# Patient Record
Sex: Male | Born: 1985 | Race: Black or African American | Hispanic: No | Marital: Single | Smoking: Current every day smoker
Health system: Southern US, Community
[De-identification: ages and names within clinical notes are randomized; demographics above are authoritative.]

## PROBLEM LIST (undated history)

## (undated) ENCOUNTER — Emergency Department (HOSPITAL_BASED_OUTPATIENT_CLINIC_OR_DEPARTMENT_OTHER): Discharge status: Absent without leave | Payer: Medicaid - Out of State

## (undated) DIAGNOSIS — G40909 Epilepsy, unspecified, not intractable, without status epilepticus: Secondary | ICD-10-CM

## (undated) DIAGNOSIS — M25519 Pain in unspecified shoulder: Secondary | ICD-10-CM

## (undated) DIAGNOSIS — M1990 Unspecified osteoarthritis, unspecified site: Secondary | ICD-10-CM

---

## 2017-07-03 ENCOUNTER — Emergency Department (HOSPITAL_COMMUNITY)
Admission: EM | Admit: 2017-07-03 | Discharge: 2017-07-03 | Disposition: A | Discharge status: Home / Self Care | Payer: Self-pay | Attending: Emergency Medicine | Admitting: Emergency Medicine

## 2017-07-03 ENCOUNTER — Encounter (HOSPITAL_COMMUNITY): Payer: Self-pay

## 2017-07-03 ENCOUNTER — Other Ambulatory Visit: Payer: Self-pay

## 2017-07-03 DIAGNOSIS — K061 Gingival enlargement: Secondary | ICD-10-CM | POA: Insufficient documentation

## 2017-07-03 DIAGNOSIS — Z5321 Procedure and treatment not carried out due to patient leaving prior to being seen by health care provider: Secondary | ICD-10-CM | POA: Insufficient documentation

## 2017-07-03 NOTE — ED Notes (Signed)
Unable to locate x 3

## 2017-07-03 NOTE — ED Triage Notes (Signed)
Patient complains of lower gum swelling and pain since am, tooth pulled a while ago

## 2017-08-19 ENCOUNTER — Emergency Department (HOSPITAL_COMMUNITY)
Admission: EM | Admit: 2017-08-19 | Discharge: 2017-08-19 | Disposition: A | Discharge status: Home / Self Care | Payer: Self-pay | Attending: Emergency Medicine | Admitting: Emergency Medicine

## 2017-08-19 ENCOUNTER — Emergency Department (HOSPITAL_COMMUNITY): Payer: Self-pay

## 2017-08-19 ENCOUNTER — Encounter (HOSPITAL_COMMUNITY): Payer: Self-pay | Admitting: Emergency Medicine

## 2017-08-19 ENCOUNTER — Other Ambulatory Visit: Payer: Self-pay

## 2017-08-19 DIAGNOSIS — M25511 Pain in right shoulder: Secondary | ICD-10-CM | POA: Insufficient documentation

## 2017-08-19 DIAGNOSIS — F17200 Nicotine dependence, unspecified, uncomplicated: Secondary | ICD-10-CM | POA: Insufficient documentation

## 2017-08-19 DIAGNOSIS — Y939 Activity, unspecified: Secondary | ICD-10-CM | POA: Insufficient documentation

## 2017-08-19 DIAGNOSIS — Y999 Unspecified external cause status: Secondary | ICD-10-CM | POA: Insufficient documentation

## 2017-08-19 DIAGNOSIS — Y929 Unspecified place or not applicable: Secondary | ICD-10-CM | POA: Insufficient documentation

## 2017-08-19 DIAGNOSIS — M25521 Pain in right elbow: Secondary | ICD-10-CM | POA: Insufficient documentation

## 2017-08-19 DIAGNOSIS — W19XXXA Unspecified fall, initial encounter: Secondary | ICD-10-CM | POA: Insufficient documentation

## 2017-08-19 MED ORDER — MORPHINE SULFATE (PF) 4 MG/ML IV SOLN: 4.0000 mg | Freq: Once | INTRAVENOUS | Status: DC

## 2017-08-19 MED ORDER — OXYCODONE-ACETAMINOPHEN 5-325 MG PO TABS
2.0000 | ORAL_TABLET | Freq: Once | ORAL | Status: AC
  Administered 2017-08-19: 2 via ORAL
  Filled 2017-08-19: qty 2

## 2017-08-19 NOTE — Progress Notes (Signed)
Orthopedic Tech Progress Note Patient Details:  Adam Ramsey 03/14/1986 [id number]  Ortho Devices Type of Ortho Device: Arm sling Ortho Device/Splint Location: RUE Ortho Device/Splint Interventions: Ordered, Application   Post Interventions Patient Tolerated: Well Instructions Provided: Care of device   [name] 08/19/2017, 10:04 PM

## 2017-08-19 NOTE — ED Triage Notes (Signed)
Patient was in shower earlier today, slipped and tried to keep himself from falling by bracing himself with his right arm.  He is now having right shoulder pain, unable to bring his arm up at all, some deformity anteriorly.  CSMTs and pulses intact.

## 2017-08-19 NOTE — Discharge Instructions (Addendum)
Please use sling as needed for pain Use ibuprofen and Tylenol as needed for pain. Follow-up with orthopedic doctor if any ongoing pain after 3 to 5 days.

## 2017-08-19 NOTE — ED Provider Notes (Addendum)
[facility] EMERGENCY DEPARTMENT Provider Note   CSN: [id number] Arrival date & time: 08/19/17  1950     History   Chief Complaint Chief Complaint  Patient presents with  . Shoulder Pain    HPI Yuchen Correll is a 32 y.o. male.  HPI  32 year old male presents today with right shoulder pain after falling.  He states he fell onto both hands.  He is having pain in the right shoulder.  He denies any numbness, tingling, or weakness.  Triage nurse documented possible deformity of right shoulder.  History reviewed. No pertinent past medical history.  There are no active problems to display for this patient.   History reviewed. No pertinent surgical history.      Home Medications    Prior to Admission medications   Not on File    Family History No family history on file.  Social History Social History   Tobacco Use  . Smoking status: Current Every Day Smoker  . Smokeless tobacco: Never Used  Substance Use Topics  . Alcohol use: Yes  . Drug use: Yes     Allergies   Patient has no known allergies.   Review of Systems Review of Systems  All other systems reviewed and are negative.    Physical Exam Updated Vital Signs BP 130/84 (BP Location: Right Arm)   Pulse 85   Temp 98 F (36.7 C) (Oral)   Resp 18   Wt 117.9 kg (260 lb)   SpO2 100%   Physical Exam  Constitutional: He is oriented to person, place, and time. He appears well-developed and well-nourished.  HENT:  Head: Normocephalic and atraumatic.  Right Ear: External ear normal.  Left Ear: External ear normal.  Eyes: Pupils are equal, round, and reactive to light. EOM are normal.  Neck: Normal range of motion.  Cardiovascular: Normal rate and regular rhythm.  Pulmonary/Chest: Effort normal and breath sounds normal.  Abdominal: Soft.  Musculoskeletal:  Right shoulder with some diffuse tenderness and some tenderness over the right elbow. No obvious deformity noted Radial pulses  intact Sensory intact ulnar and radial aspect of hand. Hand with motor intact, wrist with good flexion and extension  Neurological: He is alert and oriented to person, place, and time.  Skin: Skin is warm and dry. Capillary refill takes less than 2 seconds.  Psychiatric: He has a normal mood and affect.  Nursing note and vitals reviewed.    ED Treatments / Results  Labs (all labs ordered are listed, but only abnormal results are displayed) Labs Reviewed - No data to display  EKG None  Radiology Dg Shoulder Right  Result Date: 08/19/2017 CLINICAL DATA:  Status post fall with right shoulder pain. EXAM: RIGHT SHOULDER - 2+ VIEW COMPARISON:  None. FINDINGS: There is no evidence of acute fracture or dislocation. Deformity of the right humeral head with chronic appearance. Osteoarthritic changes at the glenohumeral joint likely posttraumatic. IMPRESSION: No acute fracture or dislocation identified about the right shoulder. Chronic appearing posttraumatic deformity of the right humeral head, with associated osteoarthritic changes of the glenohumeral joint. Electronically Signed   By: Ted Mcalpine M.D.   On: 08/19/2017 21:53   Dg Elbow 2 Views Right  Result Date: 08/19/2017 CLINICAL DATA:  Status post fall getting out of shower, with right elbow pain. Initial encounter. EXAM: RIGHT ELBOW - 2 VIEW COMPARISON:  None. FINDINGS: There is no evidence of fracture or dislocation. The visualized joint spaces are preserved. No significant joint effusion is identified.  The soft tissues are unremarkable in appearance. IMPRESSION: No evidence of fracture or dislocation. Electronically Signed   By: Roanna Raider M.D.   On: 08/19/2017 21:54    Procedures Procedures (including critical care time)  Medications Ordered in ED Medications  morphine 4 MG/ML injection 4 mg (has no administration in time range)     Initial Impression / Assessment and Plan / ED Course  I have reviewed the triage vital  signs and the nursing notes.  Pertinent labs & imaging results that were available during my care of the patient were reviewed by me and considered in my medical decision making (see chart for details).       Final Clinical Impressions(s) / ED Diagnoses   Final diagnoses:  Acute pain of right shoulder    ED Discharge Orders    None       Margarita Grizzle, MD 08/19/17 2159    Margarita Grizzle, MD 08/19/17 2218

## 2017-08-28 ENCOUNTER — Encounter (HOSPITAL_COMMUNITY): Payer: Self-pay | Admitting: Emergency Medicine

## 2017-08-28 ENCOUNTER — Emergency Department (HOSPITAL_COMMUNITY)
Admission: EM | Admit: 2017-08-28 | Discharge: 2017-08-29 | Disposition: A | Discharge status: Home / Self Care | Payer: Self-pay | Attending: Emergency Medicine | Admitting: Emergency Medicine

## 2017-08-28 DIAGNOSIS — F17210 Nicotine dependence, cigarettes, uncomplicated: Secondary | ICD-10-CM | POA: Insufficient documentation

## 2017-08-28 DIAGNOSIS — M19111 Post-traumatic osteoarthritis, right shoulder: Secondary | ICD-10-CM

## 2017-08-28 DIAGNOSIS — M19011 Primary osteoarthritis, right shoulder: Secondary | ICD-10-CM | POA: Insufficient documentation

## 2017-08-28 NOTE — ED Triage Notes (Signed)
Reports chronic right shoulder pain since 2012 after having a seizure and falling on the arm.  Has had xrays multiple times.  Last time a week ago.  Was told there was no fractures but a deformity and arthritis.

## 2017-08-29 MED ORDER — DICLOFENAC SODIUM 1 % TD GEL: 2.0000 g | Freq: Four times a day (QID) | TRANSDERMAL | 0 refills | Status: DC | PRN

## 2017-08-29 NOTE — Discharge Instructions (Signed)
Thank you for allowing me to provide your care today in the emergency department.  Please call [facility] at 8:30 AM to schedule a follow-up appointment.  You can also talk to them about financial services available to help you get established with health insurance.  You can also call Dr. Aundria Rud office, orthopedist, if you want to follow up directly with a bone doctor instead of waiting on health insurance.  Take 1000 mg of Tylenol once every 8 hours for pain control.  Apply a thin layer of diclofenac gel to areas that are sore every 6 hours as needed for pain.  If you have any fall, injury, if your fingertips turn blue, if you develop constant, worsening numbness in the right arm or hand, or other new concerning symptoms, return to the emergency department for reevaluation.

## 2017-08-29 NOTE — ED Notes (Signed)
Rt shoulder pain x 2 days , pain more than normaL AFTER A FALL 2 YEARS AGO AND PAIN WAS  increasing

## 2017-08-29 NOTE — ED Provider Notes (Signed)
[facility] EMERGENCY DEPARTMENT Provider Note   CSN: [id number] Arrival date & time: 08/28/17  2310     History   Chief Complaint Chief Complaint  Patient presents with  . Shoulder Pain    HPI Adam Ramsey is a 32 y.o. male with a history of epilepsy who presents to the emergency department with right shoulder pain.  The patient was seen in the emergency department on May 16 for right shoulder pain after he fell from standing onto his bilateral outstretched hand.  X-ray demonstrated chronic appearing posttraumatic deformity of the right humeral head with osteoarthritic changes of the glenohumeral joint.  He states that his right shoulder pain initially began in 2012 after he had a seizure and fell onto his right arm.  He has never followed up with orthopedics.  He reports constant, waxing and waning pain that has gradually worsened since he was evaluated in the ED on May 16.  He also endorses intermittent swelling to the right hand.  No new falls, injuries, numbness, weakness, neck or back pain.  He is treated his symptoms at home with 400 to 600 mg of ibuprofen 2 times daily with minimal improvement.  He was given a sling at his last visit, but is not wearing it today.   The history is provided by the patient. No language interpreter was used.  Shoulder Pain   Pertinent negatives include no numbness.    History reviewed. No pertinent past medical history.  There are no active problems to display for this patient.   History reviewed. No pertinent surgical history.      Home Medications    Prior to Admission medications   Medication Sig Start Date End Date Taking? Authorizing Provider  diclofenac sodium (VOLTAREN) 1 % GEL Apply 2 g topically 4 (four) times daily as needed. 08/29/17   Iyanla Eilers A, PA-C    Family History No family history on file.  Social History Social History   Tobacco Use  . Smoking status: Current Every Day Smoker  .  Smokeless tobacco: Never Used  Substance Use Topics  . Alcohol use: Yes  . Drug use: Yes     Allergies   Patient has no known allergies.   Review of Systems Review of Systems  Constitutional: Negative for activity change.  Respiratory: Negative for shortness of breath.   Cardiovascular: Negative for chest pain.  Gastrointestinal: Negative for abdominal pain.  Musculoskeletal: Positive for arthralgias and myalgias. Negative for back pain and joint swelling.  Skin: Negative for rash.  Neurological: Negative for weakness and numbness.     Physical Exam Updated Vital Signs BP 126/78 (BP Location: Left Arm)   Pulse 92   Temp 98.8 F (37.1 C) (Oral)   Resp 16   Ht  (1.753 m)   Wt 117.9 kg (260 lb)   SpO2 100%   BMI 38.40 kg/m   Physical Exam  Constitutional: He appears well-developed.  HENT:  Head: Normocephalic.  Eyes: Conjunctivae are normal.  Neck: Neck supple.  Cardiovascular: Normal rate and regular rhythm.  No murmur heard. Pulmonary/Chest: Effort normal.  Abdominal: Soft. He exhibits no distension.  Musculoskeletal: He exhibits tenderness. He exhibits no edema or deformity.  Diffusely tender to palpation to the right shoulder.  Right elbow and wrist are nontender to palpation.  Radial pulses are 2+ and symmetric.  Sensation is intact and symmetric to the bilateral upper extremities.  Full passive range of motion.  Decreased active range of motion secondary  to pain.  No obvious deformities, crepitus, step-offs.  Cervical, thoracic, lumbar spinous processes and bilateral paraspinal muscles are nontender to palpation.  Neurological: He is alert.  Skin: Skin is warm and dry.  Psychiatric: His behavior is normal.  Nursing note and vitals reviewed.    ED Treatments / Results  Labs (all labs ordered are listed, but only abnormal results are displayed) Labs Reviewed - No data to display  EKG None  Radiology No results found.  Procedures Procedures  (including critical care time)  Medications Ordered in ED Medications - No data to display   Initial Impression / Assessment and Plan / ED Course  I have reviewed the triage vital signs and the nursing notes.  Pertinent labs & imaging results that were available during my care of the patient were reviewed by me and considered in my medical decision making (see chart for details).     32 year old male with history of epilepsy presenting with acute exacerbation of chronic right shoulder pain.  Pain initially began in 2012 after he had a fall onto his right shoulder after having a seizure.  X-rays performed on 08/19/2017 with chronic appearing posttraumatic deformity of the right humeral head and osteoarthritic changes of the glenohumeral joint.  He has returning because pain has continued and is gradually worsening.  He is not established with orthopedics.  He does not currently have medical insurance.  Will provide the patient with resources to get established with primary care at [facility] and wellness.  Orthopedics referral has been given.  Diclofenac gel Rx will be given as the patient does not like to take pills.  Also discussed prednisone, but there is concern for lowering the seizure threshold so this medication will be deferred at this time.  Strict return precautions given.  The patient is agreeable to the plan and is hemodynamically stable and safe for discharge to home with outpatient follow-up at this time.  Final Clinical Impressions(s) / ED Diagnoses   Final diagnoses:  Post-traumatic osteoarthritis of right shoulder  Arthritis of right glenohumeral joint    ED Discharge Orders        Ordered    diclofenac sodium (VOLTAREN) 1 % GEL  4 times daily PRN     08/29/17 1220       Scarlette Hogston A, PA-C 08/29/17 1228    Nira Conn, MD 08/29/17 1650

## 2017-09-26 ENCOUNTER — Encounter (HOSPITAL_COMMUNITY): Payer: Self-pay

## 2017-09-26 ENCOUNTER — Other Ambulatory Visit: Payer: Self-pay

## 2017-09-26 ENCOUNTER — Emergency Department (HOSPITAL_COMMUNITY): Payer: Self-pay

## 2017-09-26 ENCOUNTER — Emergency Department (HOSPITAL_COMMUNITY)
Admission: EM | Admit: 2017-09-26 | Discharge: 2017-09-26 | Disposition: A | Discharge status: Home / Self Care | Payer: Self-pay | Attending: Emergency Medicine | Admitting: Emergency Medicine

## 2017-09-26 DIAGNOSIS — J069 Acute upper respiratory infection, unspecified: Secondary | ICD-10-CM | POA: Insufficient documentation

## 2017-09-26 DIAGNOSIS — B9789 Other viral agents as the cause of diseases classified elsewhere: Secondary | ICD-10-CM

## 2017-09-26 DIAGNOSIS — F17210 Nicotine dependence, cigarettes, uncomplicated: Secondary | ICD-10-CM | POA: Insufficient documentation

## 2017-09-26 DIAGNOSIS — R0789 Other chest pain: Secondary | ICD-10-CM | POA: Insufficient documentation

## 2017-09-26 LAB — CBC
HCT: 47.2 % (ref 39.0–52.0)
HEMOGLOBIN: 14.6 g/dL (ref 13.0–17.0)
MCH: 23.4 pg — ABNORMAL LOW (ref 26.0–34.0)
MCHC: 30.9 g/dL (ref 30.0–36.0)
MCV: 75.6 fL — ABNORMAL LOW (ref 78.0–100.0)
Platelets: 249 10*3/uL (ref 150–400)
RBC: 6.24 MIL/uL — AB (ref 4.22–5.81)
RDW: 14.9 % (ref 11.5–15.5)
WBC: 9.2 10*3/uL (ref 4.0–10.5)

## 2017-09-26 LAB — BASIC METABOLIC PANEL
ANION GAP: 9 (ref 5–15)
BUN: 13 mg/dL (ref 6–20)
CALCIUM: 9.1 mg/dL (ref 8.9–10.3)
CO2: 26 mmol/L (ref 22–32)
Chloride: 102 mmol/L (ref 101–111)
Creatinine, Ser: 0.87 mg/dL (ref 0.61–1.24)
Glucose, Bld: 95 mg/dL (ref 65–99)
Potassium: 3.6 mmol/L (ref 3.5–5.1)
SODIUM: 137 mmol/L (ref 135–145)

## 2017-09-26 LAB — I-STAT TROPONIN, ED: TROPONIN I, POC: 0 ng/mL (ref 0.00–0.08)

## 2017-09-26 NOTE — ED Triage Notes (Signed)
Pt reports that for the past three days he has had a productive green cough causing him CP and SOB. Denies fevers

## 2017-09-26 NOTE — ED Provider Notes (Signed)
[facility]   CSN: [id number] Arrival date & time: 09/26/17  2021     History   Chief Complaint Chief Complaint  Patient presents with  . Cough  . Chest Pain    HPI Adam Ramsey is a 32 y.o. male.  The history is provided by the patient. No language interpreter was used.  Cough  Associated symptoms include chest pain.  Chest Pain   Associated symptoms include cough.   Adam Ramsey is a 32 y.o. male who presents to the Emergency Department complaining of chest pain. He presents to the emergency department for evaluation of cough and chest pain. He states that he has had a cough for the last 3 to 4 days productive of sputum. Since yesterday he has had left sided chest pain that is only present when he coughs and he also has some shortness of breath. He has no chest pain on exertion or with positioning. He denies any fevers, hemoptysis, abdominal pain, leg swelling or pain. He does endorse some nasal congestion. Symptoms are moderate and constant in nature. He is not taking any medications for his symptoms. He has no medical problems and has no prescription medications. No history of recent surgery. No history of DVT or PE. He does smoke occasionally.  No drug use.   History reviewed. No pertinent past medical history.  There are no active problems to display for this patient.   History reviewed. No pertinent surgical history.      Home Medications    Prior to Admission medications   Medication Sig Start Date End Date Taking? Authorizing Provider  ibuprofen (ADVIL,MOTRIN) 200 MG tablet Take 200-400 mg by mouth every 4 (four) hours as needed for fever, headache, mild pain, moderate pain or cramping.   Yes [provider]  diclofenac sodium (VOLTAREN) 1 % GEL Apply 2 g topically 4 (four) times daily as needed. Patient not taking: Reported on 09/26/2017 08/29/17   Barkley BoardsMcDonald, Mia A, PA-C    Family History No family  history on file.  Social History Social History   Tobacco Use  . Smoking status: Current Every Day Smoker  . Smokeless tobacco: Never Used  Substance Use Topics  . Alcohol use: Yes  . Drug use: Yes     Allergies   Patient has no known allergies.   Review of Systems Review of Systems  Respiratory: Positive for cough.   Cardiovascular: Positive for chest pain.  All other systems reviewed and are negative.    Physical Exam Updated Vital Signs BP 122/67   Pulse 93   Temp 98.7 F (37.1 C) (Oral)   Resp 18   Ht 5\' 9"  (1.753 m)   Wt 117.9 kg (260 lb)   SpO2 100%   BMI 38.40 kg/m   Physical Exam  Constitutional: He is oriented to person, place, and time. He appears well-developed and well-nourished.  HENT:  Head: Normocephalic and atraumatic.  TMs clear bilaterally. Posterior oropharynx without any erythema or exudates.  Cardiovascular: Normal rate and regular rhythm.  No murmur heard. Pulmonary/Chest: Effort normal and breath sounds normal. No stridor. No respiratory distress.  Abdominal: Soft. There is no tenderness. There is no rebound and no guarding.  Musculoskeletal: He exhibits no edema or tenderness.  Neurological: He is alert and oriented to person, place, and time.  Skin: Skin is warm and dry.  Psychiatric: He has a normal mood and affect. His behavior is normal.  Nursing note and vitals reviewed.  ED Treatments / Results  Labs (all labs ordered are listed, but only abnormal results are displayed) Labs Reviewed  CBC - Abnormal; Notable for the following components:      Result Value   RBC 6.24 (*)    MCV 75.6 (*)    MCH 23.4 (*)    All other components within normal limits  BASIC METABOLIC PANEL  I-STAT TROPONIN, ED    EKG EKG Interpretation  Date/Time:  Sunday September 26 2017 20:24:44 EDT Ventricular Rate:  82 PR Interval:  162 QRS Duration: 76 QT Interval:  344 QTC Calculation: 401 R Axis:   47 Text Interpretation:  Normal sinus  rhythm ST elevation, consider early repolarization Borderline ECG Confirmed by Tilden Fossa [phone]) on 09/26/2017 8:53:47 PM   Radiology Dg Chest 2 View  Result Date: 09/26/2017 CLINICAL DATA:  32 y/o M; cough, congestion, chest pain, back pain, and shortness of breath for 3 days. EXAM: CHEST - 2 VIEW COMPARISON:  None. FINDINGS: The heart size and mediastinal contours are within normal limits. Both lungs are clear. The visualized skeletal structures are unremarkable. IMPRESSION: No acute pulmonary process identified. Electronically Signed   By: Mitzi Hansen M.D.   On: 09/26/2017 21:24    Procedures Procedures (including critical care time)  Medications Ordered in ED Medications - No data to display   Initial Impression / Assessment and Plan / ED Course  I have reviewed the triage vital signs and the nursing notes.  Pertinent labs & imaging results that were available during my care of the patient were reviewed by me and considered in my medical decision making (see chart for details).     Patient here for evaluation of chest pain that is only present with coughing, cough the last few days. He is non-toxic appearing on examination with no respiratory distress. Presentation is not consistent with ACS, PE, dissection, pericarditis, pneumonia. Discussed with patient home care for URI with musculaokeletal chest pain. Discussed over-the-counter anti-tussives. Discussed outpatient follow-up and return precautions.  Final Clinical Impressions(s) / ED Diagnoses   Final diagnoses:  Viral URI with cough  Atypical chest pain    ED Discharge Orders    None       Tilden Fossa, MD 09/26/17 2157

## 2017-10-21 ENCOUNTER — Encounter (HOSPITAL_COMMUNITY): Payer: Self-pay | Admitting: Emergency Medicine

## 2017-10-21 ENCOUNTER — Other Ambulatory Visit: Payer: Self-pay

## 2017-10-21 ENCOUNTER — Emergency Department (HOSPITAL_COMMUNITY)
Admission: EM | Admit: 2017-10-21 | Discharge: 2017-10-21 | Disposition: A | Discharge status: Home / Self Care | Payer: Self-pay | Attending: Emergency Medicine | Admitting: Emergency Medicine

## 2017-10-21 DIAGNOSIS — M25511 Pain in right shoulder: Secondary | ICD-10-CM

## 2017-10-21 DIAGNOSIS — F17200 Nicotine dependence, unspecified, uncomplicated: Secondary | ICD-10-CM | POA: Insufficient documentation

## 2017-10-21 DIAGNOSIS — M7501 Adhesive capsulitis of right shoulder: Secondary | ICD-10-CM | POA: Insufficient documentation

## 2017-10-21 HISTORY — DX: Unspecified osteoarthritis, unspecified site: M19.90

## 2017-10-21 MED ORDER — DICLOFENAC SODIUM 1 % TD GEL: 2.0000 g | Freq: Four times a day (QID) | TRANSDERMAL | 0 refills | Status: DC | PRN

## 2017-10-21 MED ORDER — LIDOCAINE 5 % EX PTCH: 1.0000 | MEDICATED_PATCH | CUTANEOUS | 0 refills | Status: DC

## 2017-10-21 NOTE — ED Triage Notes (Signed)
Patient complains of right shoulder pain, reports history of arthritis in shoulder. States he normally takes over the counter medication for pain but since last night OTC medication does not relieve pain. Denies recent injury. No other complaints at this time.

## 2017-10-21 NOTE — ED Provider Notes (Signed)
[facility]   CSN: [id number] Arrival date & time: 10/21/17  1033     History   Chief Complaint Chief Complaint  Patient presents with  . Shoulder Pain    HPI Harvey Oneta Racklford is a 32 y.o. male.  32 year old male presents with complaint of right shoulder pain.  Patient states he had this pain for several months, seen here previously for the shoulder at the time of the fall and since then.  He denies any falls or injuries since that time.  States pain is constant, worse with movement, unable to really raise his arm secondary to pain in his shoulder.  Patient has been taking over-the-counter ibuprofen without relief.  Has not followed up with orthopedics.  Denies neck pain or arm numbness.  No other complaints or concerns.     Past Medical History:  Diagnosis Date  . Arthritis     There are no active problems to display for this patient.   History reviewed. No pertinent surgical history.      Home Medications    Prior to Admission medications   Medication Sig Start Date End Date Taking? Authorizing Provider  diclofenac sodium (VOLTAREN) 1 % GEL Apply 2 g topically 4 (four) times daily as needed. 10/21/17   Jeannie FendMurphy, Jarita Raval A, PA-C  ibuprofen (ADVIL,MOTRIN) 200 MG tablet Take 200-400 mg by mouth every 4 (four) hours as needed for fever, headache, mild pain, moderate pain or cramping.    [provider]  lidocaine (LIDODERM) 5 % Place 1 patch onto the skin daily. Remove & Discard patch within 12 hours or as directed by MD 10/21/17   Jeannie FendMurphy, Kemani Demarais A, PA-C    Family History No family history on file.  Social History Social History   Tobacco Use  . Smoking status: Current Every Day Smoker  . Smokeless tobacco: Never Used  Substance Use Topics  . Alcohol use: Yes  . Drug use: Yes     Allergies   Patient has no known allergies.   Review of Systems Review of Systems  Constitutional: Negative for fever.    Musculoskeletal: Positive for arthralgias and myalgias. Negative for back pain, joint swelling, neck pain and neck stiffness.  Skin: Negative for rash and wound.  Allergic/Immunologic: Negative for immunocompromised state.  Neurological: Negative for weakness and numbness.  Hematological: Negative for adenopathy.  Psychiatric/Behavioral: Negative for confusion.  All other systems reviewed and are negative.    Physical Exam Updated Vital Signs BP (!) 142/82 (BP Location: Right Arm)   Pulse 72   Temp 97.9 F (36.6 C) (Oral)   Resp 16   Ht 5\' 10"  (1.778 m)   Wt 117.9 kg (260 lb)   SpO2 100%   BMI 37.31 kg/m   Physical Exam  Constitutional: He is oriented to person, place, and time. He appears well-developed and well-nourished. No distress.  HENT:  Head: Normocephalic and atraumatic.  Neck: Normal range of motion. Neck supple.  Cardiovascular: Intact distal pulses.  Pulmonary/Chest: Effort normal.  Musculoskeletal: He exhibits tenderness. He exhibits no deformity.       Right shoulder: He exhibits decreased range of motion and tenderness. He exhibits no bony tenderness, no swelling, no effusion, no crepitus, no deformity, normal pulse and normal strength.       Arms: Generalized right shoulder tenderness, unable to raise arm above shoulder level, unable to externally rotate secondary to pain.  Neurological: He is alert and oriented to person, place, and time.  Skin: Skin is warm and dry. No rash noted. He is not diaphoretic. No erythema.  Psychiatric: He has a normal mood and affect. His behavior is normal.  Nursing note and vitals reviewed.    ED Treatments / Results  Labs (all labs ordered are listed, but only abnormal results are displayed) Labs Reviewed - No data to display  EKG None  Radiology No results found.  Procedures Procedures (including critical care time)  Medications Ordered in ED Medications - No data to display   Initial Impression /  Assessment and Plan / ED Course  I have reviewed the triage vital signs and the nursing notes.  Pertinent labs & imaging results that were available during my care of the patient were reviewed by me and considered in my medical decision making (see chart for details).  Clinical Course as of Oct 21 1205  Thu Oct 21, 2017  [id number] 32 year old male with chronic right shoulder pain.  On exam range of motion limited secondary to pain, discussed developing frozen shoulder with patient and recommend he work on range of motion exercises and follow-up with orthopedics.  Patient has a history of seizures, do not recommend muscle relaxers.  I will refill the Voltaren given to him at his previous visit and prescribed lidocaine patches.  Demonstrated home exercises and information given on home exercises for patient, referral to orthopedics.   [LM]    Clinical Course User Index [LM] Jeannie Fend, PA-C    Final Clinical Impressions(s) / ED Diagnoses   Final diagnoses:  Acute pain of right shoulder  Adhesive capsulitis of right shoulder    ED Discharge Orders        Ordered    diclofenac sodium (VOLTAREN) 1 % GEL  4 times daily PRN     10/21/17 1151    lidocaine (LIDODERM) 5 %  Every 24 hours     10/21/17 1151       Alden Hipp 10/21/17 1207    Loren Racer, MD 10/21/17 2133

## 2017-10-21 NOTE — Discharge Instructions (Addendum)
Apply Voltaren gel to area for pain.  Alternatively, you can apply Lidoderm patches to the area for pain as well.  Work on range of motion exercises as demonstrated and discussed.  Follow-up with orthopedics, referral given.

## 2017-11-16 ENCOUNTER — Emergency Department (HOSPITAL_COMMUNITY)
Admission: EM | Admit: 2017-11-16 | Discharge: 2017-11-17 | Disposition: A | Discharge status: Home / Self Care | Payer: Self-pay | Attending: Emergency Medicine | Admitting: Emergency Medicine

## 2017-11-16 ENCOUNTER — Encounter (HOSPITAL_COMMUNITY): Payer: Self-pay | Admitting: Emergency Medicine

## 2017-11-16 DIAGNOSIS — M25511 Pain in right shoulder: Secondary | ICD-10-CM | POA: Insufficient documentation

## 2017-11-16 DIAGNOSIS — G8929 Other chronic pain: Secondary | ICD-10-CM | POA: Insufficient documentation

## 2017-11-16 DIAGNOSIS — F17210 Nicotine dependence, cigarettes, uncomplicated: Secondary | ICD-10-CM | POA: Insufficient documentation

## 2017-11-16 NOTE — ED Triage Notes (Signed)
Pt reports R shoulder pain, chronic in nature. Pt states hx of arthritis. Denies injury.

## 2017-11-17 ENCOUNTER — Other Ambulatory Visit: Payer: Self-pay

## 2017-11-17 MED ORDER — METHOCARBAMOL 500 MG PO TABS: 500.0000 mg | ORAL_TABLET | Freq: Two times a day (BID) | ORAL | 0 refills | Status: DC

## 2017-11-17 MED ORDER — LIDOCAINE 5 % EX PTCH: 1.0000 | MEDICATED_PATCH | CUTANEOUS | 0 refills | Status: DC

## 2017-11-17 MED ORDER — DICLOFENAC SODIUM 1 % TD GEL: 2.0000 g | Freq: Four times a day (QID) | TRANSDERMAL | 0 refills | Status: DC | PRN

## 2017-11-17 NOTE — Discharge Instructions (Addendum)
Please make sure to follow with orthopedic doctor.  Perform range of motion exercises as provided. Please take the voltaren as prescribed for pain. Do not take any additional NSAIDs including Motrin, Aleve, Ibuprofen, Advil. Please the the robaxin for muscle relaxation. This medication will make you drowsy so avoid situation that could place you in danger.

## 2017-11-17 NOTE — ED Provider Notes (Signed)
[facility] EMERGENCY [facility] Provider Note   CSN: [id number] Arrival date & time: 11/16/17  2332     History   Chief Complaint Chief Complaint  Patient presents with  . Shoulder Pain    HPI Adam Ramsey is a 32 y.o. male.  HPI 32 year old African-American male with history of chronic right shoulder pain presents to the ED today for evaluation of ongoing right shoulder pain.  Patient reports that he has chronic pain to the right shoulder after a fall several years ago.  Patient states that he has tried several over-the-counter medications for his pain without any relief.  States the pain is worse with range of motion and lifting.  Patient seen in the ED last month for same and given Voltaren cream and lidocaine patches.  Did not get the Voltaren cream filled but states lidocaine patches did help.  States that it was just "one of those days" that the pain was worse in his right shoulder.  States he knows he needs to see a orthopedic doctor but has not done so yet.  Denies any new injury to the shoulder.  Denies any paresthesias.  He has not taken anything today for his pain.   Past Medical History:  Diagnosis Date  . Arthritis     There are no active problems to display for this patient.   History reviewed. No pertinent surgical history.      Home Medications    Prior to Admission medications   Medication Sig Start Date End Date Taking? Authorizing Provider  diclofenac sodium (VOLTAREN) 1 % GEL Apply 2 g topically 4 (four) times daily as needed. 10/21/17   Jeannie FendMurphy, Laura A, PA-C  ibuprofen (ADVIL,MOTRIN) 200 MG tablet Take 200-400 mg by mouth every 4 (four) hours as needed for fever, headache, mild pain, moderate pain or cramping.    [provider]  lidocaine (LIDODERM) 5 % Place 1 patch onto the skin daily. Remove & Discard patch within 12 hours or as directed by MD 10/21/17   Jeannie FendMurphy, Laura A, PA-C    Family History No family history on  file.  Social History Social History   Tobacco Use  . Smoking status: Current Every Day Smoker  . Smokeless tobacco: Never Used  Substance Use Topics  . Alcohol use: Yes  . Drug use: Yes     Allergies   Patient has no known allergies.   Review of Systems Review of Systems  Constitutional: Negative for fever.  Musculoskeletal: Positive for arthralgias, back pain and myalgias.  Skin: Negative for color change.  Neurological: Negative for weakness and numbness.     Physical Exam Updated Vital Signs BP 121/76 (BP Location: Left Arm)   Pulse 92   Temp 99 F (37.2 C) (Oral)   Resp 19   Ht 5\' 10"  (1.778 m)   Wt 117.9 kg   SpO2 99%   BMI 37.31 kg/m   Physical Exam  Constitutional: He appears well-developed and well-nourished. No distress.  HENT:  Head: Normocephalic and atraumatic.  Eyes: Right eye exhibits no discharge. Left eye exhibits no discharge. No scleral icterus.  Neck: Normal range of motion.  Pulmonary/Chest: No respiratory distress.  Musculoskeletal:       Right shoulder: He exhibits decreased range of motion, tenderness, bony tenderness and pain. He exhibits no swelling, no effusion, no crepitus, no deformity and no laceration.  Positive Hawkins and Neer's test.  Limited range of motion secondary to pain.  Grip strength equal.  Radial  pulses 2+ bilaterally.  Brisk cap refill.  Neurological: He is alert.  Skin: Skin is warm and dry. Capillary refill takes less than 2 seconds. No pallor.  Psychiatric: His behavior is normal. Judgment and thought content normal.  Nursing note and vitals reviewed.    ED Treatments / Results  Labs (all labs ordered are listed, but only abnormal results are displayed) Labs Reviewed - No data to display  EKG None  Radiology No results found.  Procedures Procedures (including critical care time)  Medications Ordered in ED Medications - No data to display   Initial Impression / Assessment and Plan / ED Course  I  have reviewed the triage vital signs and the nursing notes.  Pertinent labs & imaging results that were available during my care of the patient were reviewed by me and considered in my medical decision making (see chart for details).     Patient presents to the ED for ongoing right shoulder pain.  Has not followed up with orthopedic doctor yet.  She is neurovascularly intact.  No new injury.  No indication for x-ray at this visit.  Pain seems consistent with possible rotator cuff pathology.  Will prescribe Voltaren cream and refill lidocaine patches.  We will give stretches to be performed at home and encouraged ice and heat therapy.  Pt is hemodynamically stable, in NAD, & able to ambulate in the ED. Evaluation does not show pathology that would require ongoing emergent intervention or inpatient treatment. I explained the diagnosis to the patient. Pain has been managed & has no complaints prior to dc. Pt is comfortable with above plan and is stable for discharge at this time. All questions were answered prior to disposition. Strict return precautions for f/u to the ED were discussed. Encouraged follow up with PCP.   Final Clinical Impressions(s) / ED Diagnoses   Final diagnoses:  Chronic right shoulder pain    ED Discharge Orders         Ordered    diclofenac sodium (VOLTAREN) 1 % GEL  4 times daily PRN     11/17/17 0051    lidocaine (LIDODERM) 5 %  Every 24 hours     11/17/17 0051    methocarbamol (ROBAXIN) 500 MG tablet  2 times daily     11/17/17 0051           Rise MuLeaphart, Kenneth T, PA-C 11/17/17 0053    Nira Connardama, Pedro Eduardo, MD 11/17/17 [phone]

## 2017-12-12 ENCOUNTER — Emergency Department (HOSPITAL_COMMUNITY)
Admission: EM | Admit: 2017-12-12 | Discharge: 2017-12-13 | Disposition: A | Discharge status: Home / Self Care | Payer: Self-pay | Attending: Emergency Medicine | Admitting: Emergency Medicine

## 2017-12-12 DIAGNOSIS — G8929 Other chronic pain: Secondary | ICD-10-CM

## 2017-12-12 DIAGNOSIS — F17210 Nicotine dependence, cigarettes, uncomplicated: Secondary | ICD-10-CM | POA: Insufficient documentation

## 2017-12-12 DIAGNOSIS — Z79899 Other long term (current) drug therapy: Secondary | ICD-10-CM | POA: Insufficient documentation

## 2017-12-12 DIAGNOSIS — M25511 Pain in right shoulder: Secondary | ICD-10-CM | POA: Insufficient documentation

## 2017-12-12 NOTE — ED Triage Notes (Signed)
Patient c/o "complications of his right shoulder". States that he has been having pain in his right shoulder for 6+ years and has had xrays here in the past for same.

## 2017-12-13 ENCOUNTER — Other Ambulatory Visit: Payer: Self-pay

## 2017-12-13 MED ORDER — LIDOCAINE 5 % EX PTCH
1.0000 | MEDICATED_PATCH | CUTANEOUS | Status: DC
  Administered 2017-12-13: 1 via TRANSDERMAL
  Filled 2017-12-13: qty 1

## 2017-12-13 MED ORDER — IBUPROFEN 400 MG PO TABS
600.0000 mg | ORAL_TABLET | Freq: Once | ORAL | Status: AC
Start: 2017-12-13 — End: 2017-12-13
  Administered 2017-12-13: 600 mg via ORAL
  Filled 2017-12-13: qty 1

## 2017-12-13 MED ORDER — LIDOCAINE 5 % EX PTCH: 1.0000 | MEDICATED_PATCH | CUTANEOUS | 0 refills | Status: DC

## 2017-12-13 MED ORDER — NAPROXEN 500 MG PO TABS: 500.0000 mg | ORAL_TABLET | Freq: Two times a day (BID) | ORAL | 0 refills | Status: DC

## 2017-12-13 NOTE — ED Provider Notes (Signed)
[facility] EMERGENCY DEPARTMENT Provider Note   CSN: [id number] Arrival date & time: 12/12/17  2337     History   Chief Complaint Chief Complaint  Patient presents with  . Shoulder Pain    HPI Adam Ramsey is a 32 y.o. male with past medical history of arthritis presents to ED for evaluation of right shoulder pain.  States that pain has been present for the past several years.  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  States that he has been using Tylenol with no improvement in the symptoms.  Lidocaine patches have helped him in the past.  He has not seen an orthopedic provider in the past.  States that his pain will be exacerbated by certain movements including heavy lifting at work.  Denies any reinjury or falls.  Denies any prior surgeries, or new trauma to the area, numbness in arms or legs, fever, swelling of the joint.  HPI  Past Medical History:  Diagnosis Date  . Arthritis     There are no active problems to display for this patient.   No past surgical history on file.      Home Medications    Prior to Admission medications   Medication Sig Start Date End Date Taking? Authorizing Provider  diclofenac sodium (VOLTAREN) 1 % GEL Apply 2 g topically 4 (four) times daily as needed. 11/17/17   Rise Mu, PA-C  ibuprofen (ADVIL,MOTRIN) 200 MG tablet Take 200-400 mg by mouth every 4 (four) hours as needed for fever, headache, mild pain, moderate pain or cramping.    [provider]  lidocaine (LIDODERM) 5 % Place 1 patch onto the skin daily. Remove & Discard patch within 12 hours or as directed by MD 12/13/17   Dietrich Pates, PA-C  methocarbamol (ROBAXIN) 500 MG tablet Take 1 tablet (500 mg total) by mouth 2 (two) times daily. 11/17/17   Rise Mu, PA-C  naproxen (NAPROSYN) 500 MG tablet Take 1 tablet (500 mg total) by mouth 2 (two) times daily. 12/13/17   Dietrich Pates, PA-C     Family History No family history on file.  Social History Social History   Tobacco Use  . Smoking status: Current Every Day Smoker  . Smokeless tobacco: Never Used  Substance Use Topics  . Alcohol use: Yes  . Drug use: Yes     Allergies   Patient has no known allergies.   Review of Systems Review of Systems  Constitutional: Negative for chills and fever.  Musculoskeletal: Positive for arthralgias. Negative for myalgias.  Skin: Negative for wound.  Neurological: Negative for weakness and numbness.     Physical Exam Updated Vital Signs BP 122/72 (BP Location: Right Arm)   Pulse 100   Temp 97.8 F (36.6 C) (Oral)   Resp 18   SpO2 100%   Physical Exam  Constitutional: He appears well-developed and well-nourished. No distress.  HENT:  Head: Normocephalic and atraumatic.  Eyes: Conjunctivae and EOM are normal. No scleral icterus.  Neck: Normal range of motion.  Pulmonary/Chest: Effort normal. No respiratory distress.  Musculoskeletal: He exhibits tenderness.  Tenderness palpation of the anterior right shoulder.  Range of motion limited secondary to pain.  Area is neurovascularly intact.  No deformity, erythema or warmth of joint noted.  Neurological: He is alert.  Skin: No rash noted. He is not diaphoretic.  Psychiatric: He has a normal mood and affect.  Nursing note  and vitals reviewed.    ED Treatments / Results  Labs (all labs ordered are listed, but only abnormal results are displayed) Labs Reviewed - No data to display  EKG None  Radiology No results found.  Procedures Procedures (including critical care time)  Medications Ordered in ED Medications  ibuprofen (ADVIL,MOTRIN) tablet 600 mg (has no administration in time range)  lidocaine (LIDODERM) 5 % 1 patch (has no administration in time range)     Initial Impression / Assessment and Plan / ED Course  I have reviewed the triage vital signs and the nursing notes.  Pertinent labs &  imaging results that were available during my care of the patient were reviewed by me and considered in my medical decision making (see chart for details).     Patient presents to ED for evaluation of right shoulder pain.  Pain has been intermittent for the past several years.  He initially injured it when he had a seizure several years ago.  Has not seen orthopedic provider in the past.  Denies any reinjury but states that his pain will be exacerbated by certain movements including heavy lifting at work.  Denies any numbness, weakness or reinjury.  On exam there is tenderness palpation of the anterior shoulder with the limited range of motion noted.  No overlying edema, erythema or warmth of joint noted.  Suspect this is an exacerbation of his chronic pain.  Doubt infectious or vascular cause.  No indication for imaging at this time as there has been no reinjury.  Will give lidocaine patch, anti-inflammatories and orthopedic follow-up.  Advised to return to ED for any severe worsening symptoms.  Portions of this note were generated with Scientist, clinical (histocompatibility and immunogenetics). Dictation errors may occur despite best attempts at proofreading.   Final Clinical Impressions(s) / ED Diagnoses   Final diagnoses:  Chronic right shoulder pain    ED Discharge Orders         Ordered    lidocaine (LIDODERM) 5 %  Every 24 hours     12/13/17 0137    naproxen (NAPROSYN) 500 MG tablet  2 times daily     12/13/17 0137           Dietrich Pates, PA-C 12/13/17 0150    Ward, Layla Maw, DO 12/13/17 [phone]

## 2017-12-13 NOTE — Discharge Instructions (Signed)
Return to ED for worsening symptoms, injuries or falls, numbness in your arms or legs, chest pain.

## 2018-01-21 ENCOUNTER — Emergency Department (HOSPITAL_COMMUNITY)
Admission: EM | Admit: 2018-01-21 | Discharge: 2018-01-21 | Disposition: A | Discharge status: Home / Self Care | Payer: Self-pay | Attending: Emergency Medicine | Admitting: Emergency Medicine

## 2018-01-21 ENCOUNTER — Other Ambulatory Visit: Payer: Self-pay

## 2018-01-21 ENCOUNTER — Encounter (HOSPITAL_COMMUNITY): Payer: Self-pay | Admitting: Emergency Medicine

## 2018-01-21 DIAGNOSIS — G8929 Other chronic pain: Secondary | ICD-10-CM | POA: Insufficient documentation

## 2018-01-21 DIAGNOSIS — M25511 Pain in right shoulder: Secondary | ICD-10-CM | POA: Insufficient documentation

## 2018-01-21 DIAGNOSIS — F17210 Nicotine dependence, cigarettes, uncomplicated: Secondary | ICD-10-CM | POA: Insufficient documentation

## 2018-01-21 DIAGNOSIS — Z79899 Other long term (current) drug therapy: Secondary | ICD-10-CM | POA: Insufficient documentation

## 2018-01-21 MED ORDER — METHOCARBAMOL 500 MG PO TABS: 500.0000 mg | ORAL_TABLET | Freq: Two times a day (BID) | ORAL | 0 refills | Status: DC

## 2018-01-21 MED ORDER — LIDOCAINE 5 % EX PTCH
1.0000 | MEDICATED_PATCH | CUTANEOUS | Status: DC
  Administered 2018-01-21: 1 via TRANSDERMAL
  Filled 2018-01-21: qty 1

## 2018-01-21 MED ORDER — IBUPROFEN 400 MG PO TABS
600.0000 mg | ORAL_TABLET | Freq: Once | ORAL | Status: AC
  Administered 2018-01-21: 600 mg via ORAL
  Filled 2018-01-21: qty 1

## 2018-01-21 NOTE — ED Provider Notes (Signed)
[facility] EMERGENCY DEPARTMENT Provider Note   CSN: [id number] Arrival date & time: 01/21/18  2046     History   Chief Complaint Chief Complaint  Patient presents with  . Shoulder Pain    HPI Adam Ramsey is a 32 y.o. male with a past medical history of arthritis who presents to ED for evaluation of right shoulder pain.  States that this pain has overall been present for the past several years due to rotator cuff issues after a seizure.  However, the specific episode flared up about 2 hours ago.  States that he woke up and felt like he was "laying on it wrong for too long."  He states that lidocaine patches have helped him in the past.  He usually takes as needed over-the-counter medication such as Tylenol or ibuprofen from time to time.  He states that he does not have time to follow-up in orthopedic doctor and has not done so yet.  Denies any prior surgeries in the area, new trauma or injuries, numbness in arms or legs, fever, swelling of the joint.  HPI  Past Medical History:  Diagnosis Date  . Arthritis     There are no active problems to display for this patient.   History reviewed. No pertinent surgical history.      Home Medications    Prior to Admission medications   Medication Sig Start Date End Date Taking? Authorizing Provider  diclofenac sodium (VOLTAREN) 1 % GEL Apply 2 g topically 4 (four) times daily as needed. 11/17/17   Rise Mu, PA-C  ibuprofen (ADVIL,MOTRIN) 200 MG tablet Take 200-400 mg by mouth every 4 (four) hours as needed for fever, headache, mild pain, moderate pain or cramping.    [provider]  lidocaine (LIDODERM) 5 % Place 1 patch onto the skin daily. Remove & Discard patch within 12 hours or as directed by MD 12/13/17   Dietrich Pates, PA-C  methocarbamol (ROBAXIN) 500 MG tablet Take 1 tablet (500 mg total) by mouth 2 (two) times daily. 01/21/18   Murdis Flitton, PA-C  naproxen (NAPROSYN) 500 MG tablet Take 1  tablet (500 mg total) by mouth 2 (two) times daily. 12/13/17   Dietrich Pates, PA-C    Family History No family history on file.  Social History Social History   Tobacco Use  . Smoking status: Current Every Day Smoker    Packs/day: 0.50    Years: 15.00    Pack years: 7.50    Types: Cigarettes  . Smokeless tobacco: Never Used  Substance Use Topics  . Alcohol use: Yes  . Drug use: Yes     Allergies   Patient has no known allergies.   Review of Systems Review of Systems  Constitutional: Negative for chills and fever.  Musculoskeletal: Positive for arthralgias. Negative for neck pain.  Neurological: Negative for weakness and numbness.     Physical Exam Updated Vital Signs BP 134/82   Pulse 90   Temp 98.7 F (37.1 C) (Oral)   Resp 16   Ht 5\' 10"  (1.778 m)   Wt 117.9 kg   SpO2 100%   BMI 37.31 kg/m   Physical Exam  Constitutional: He appears well-developed and well-nourished. No distress.  HENT:  Head: Normocephalic and atraumatic.  Eyes: Conjunctivae and EOM are normal. No scleral icterus.  Neck: Normal range of motion.  Pulmonary/Chest: Effort normal. No respiratory distress.  Musculoskeletal: Normal range of motion. He exhibits tenderness. He exhibits no edema or deformity.  Tenderness palpation of the anterior right shoulder.  Range of motion exacerbates pain.  Area is neurovascularly intact with 2+ radial pulse palpated bilaterally and equal grip strength, sensation intact to light touch of bilateral upper extremities.  No deformity, erythema or warmth of joint noted.   Neurological: He is alert.  Skin: No rash noted. He is not diaphoretic.  Psychiatric: He has a normal mood and affect.  Nursing note and vitals reviewed.    ED Treatments / Results  Labs (all labs ordered are listed, but only abnormal results are displayed) Labs Reviewed - No data to display  EKG None  Radiology No results found.  Procedures Procedures (including critical care  time)  Medications Ordered in ED Medications  lidocaine (LIDODERM) 5 % 1 patch (has no administration in time range)  ibuprofen (ADVIL,MOTRIN) tablet 600 mg (has no administration in time range)     Initial Impression / Assessment and Plan / ED Course  I have reviewed the triage vital signs and the nursing notes.  Pertinent labs & imaging results that were available during my care of the patient were reviewed by me and considered in my medical decision making (see chart for details).     Patient presents to ED for evaluation of right shoulder pain.  He has had pain for the past several years after an injury.  However, this episode began 2 hours ago after he "laid on it wrong."  Has not yet followed up with an orthopedist. Denies any reinjury but states that his pain will be exacerbated by certain movements including heavy lifting at work.  Denies any numbness, weakness or reinjury.  On exam there is tenderness palpation of the anterior shoulder with pain with range of motion noted. No overlying edema, erythema or warmth of joint noted.  Suspect this is an exacerbation of his chronic pain.  Doubt infectious or vascular cause.  No indication for imaging at this time as there has been no reinjury.    Will give muscle relaxer. Advised to return to ED for any severe worsening symptoms.  Portions of this note were generated with Scientist, clinical (histocompatibility and immunogenetics). Dictation errors may occur despite best attempts at proofreading.  Final Clinical Impressions(s) / ED Diagnoses   Final diagnoses:  Chronic right shoulder pain    ED Discharge Orders         Ordered    methocarbamol (ROBAXIN) 500 MG tablet  2 times daily     01/21/18 2205           Dietrich Pates, PA-C 01/21/18 2208    Benjiman Core, MD 01/22/18 0006

## 2018-01-21 NOTE — ED Triage Notes (Signed)
Pt reports 6/10 rt shoulder pain that flared up today. Been going on for 7 years, otc medications and therapies are not relieving the pain. No new injuries. Pt reports he was laying on it for awhile. Hx of rotator cuff injury, no surgery.

## 2018-01-21 NOTE — Discharge Instructions (Signed)
Return to ED for worsening symptoms, injuries or falls, numbness in arms or legs, trouble moving your arm, chest pain.

## 2018-01-30 ENCOUNTER — Encounter (HOSPITAL_COMMUNITY): Payer: Self-pay | Admitting: Emergency Medicine

## 2018-01-30 ENCOUNTER — Emergency Department (HOSPITAL_COMMUNITY): Payer: Self-pay

## 2018-01-30 ENCOUNTER — Other Ambulatory Visit: Payer: Self-pay

## 2018-01-30 ENCOUNTER — Emergency Department (HOSPITAL_COMMUNITY)
Admission: EM | Admit: 2018-01-30 | Discharge: 2018-01-31 | Disposition: A | Discharge status: Home / Self Care | Payer: Self-pay | Attending: Emergency Medicine | Admitting: Emergency Medicine

## 2018-01-30 DIAGNOSIS — M25511 Pain in right shoulder: Secondary | ICD-10-CM | POA: Insufficient documentation

## 2018-01-30 DIAGNOSIS — Z79899 Other long term (current) drug therapy: Secondary | ICD-10-CM | POA: Insufficient documentation

## 2018-01-30 DIAGNOSIS — F17210 Nicotine dependence, cigarettes, uncomplicated: Secondary | ICD-10-CM | POA: Insufficient documentation

## 2018-01-30 HISTORY — DX: Pain in unspecified shoulder: M25.519

## 2018-01-30 NOTE — ED Triage Notes (Signed)
History of R shoulder pain x 7 years.  Pain worse since picking up a bag of potatoes at work around 10pm.  States he felt like shoulder "popped out and back in."

## 2018-01-31 MED ORDER — LIDOCAINE 5 % EX PTCH: 1.0000 | MEDICATED_PATCH | CUTANEOUS | 0 refills | Status: DC

## 2018-01-31 MED ORDER — LIDOCAINE 5 % EX PTCH
1.0000 | MEDICATED_PATCH | CUTANEOUS | Status: DC
  Administered 2018-01-31: 1 via TRANSDERMAL
  Filled 2018-01-31: qty 1

## 2018-01-31 MED ORDER — NAPROXEN 250 MG PO TABS
500.0000 mg | ORAL_TABLET | Freq: Once | ORAL | Status: AC
  Administered 2018-01-31: 500 mg via ORAL
  Filled 2018-01-31: qty 2

## 2018-01-31 NOTE — ED Notes (Signed)
Reviewed d/c instructions with pt, who verbalized understanding and had no outstanding questions. Pt departed in NAD, refused use of wheelchair.   

## 2018-01-31 NOTE — ED Provider Notes (Signed)
[facility] EMERGENCY DEPARTMENT Provider Note   CSN: [id number] Arrival date & time: 01/30/18  2327     History   Chief Complaint Chief Complaint  Patient presents with  . Shoulder Pain    HPI Adam Ramsey is a 32 y.o. male presenting for acute on chronic right shoulder pain.  Patient states that he has had chronic right shoulder pain for years however his pain worsened today around 10 PM while at work.  Patient states that he was picking up a sac of potatoes when he felt his shoulder "pop ".  Patient concerned that his arm may have briefly come out of socket however states that he is unsure if this occurred.  Patient states that he was able to move his arm immediately after the incident however has had increased pain since then.  Patient describes his pain as a constant moderate intensity throbbing pain that is worsened with movement of the right shoulder.  Patient has not taken anything for his pain since 10 PM.  Patient is requesting referral to orthopedic specialist for valuation of his chronic shoulder pain.  HPI  Past Medical History:  Diagnosis Date  . Arthritis   . Shoulder pain     There are no active problems to display for this patient.   History reviewed. No pertinent surgical history.      Home Medications    Prior to Admission medications   Medication Sig Start Date End Date Taking? Authorizing Provider  diclofenac sodium (VOLTAREN) 1 % GEL Apply 2 g topically 4 (four) times daily as needed. 11/17/17   Rise Mu, PA-C  ibuprofen (ADVIL,MOTRIN) 200 MG tablet Take 200-400 mg by mouth every 4 (four) hours as needed for fever, headache, mild pain, moderate pain or cramping.    [provider]  lidocaine (LIDODERM) 5 % Place 1 patch onto the skin daily. Remove & Discard patch within 12 hours or as directed by MD 01/31/18   Bill Salinas, PA-C  methocarbamol (ROBAXIN) 500 MG tablet Take 1 tablet (500 mg total) by mouth 2  (two) times daily. 01/21/18   Khatri, Hina, PA-C  naproxen (NAPROSYN) 500 MG tablet Take 1 tablet (500 mg total) by mouth 2 (two) times daily. 12/13/17   Dietrich Pates, PA-C    Family History No family history on file.  Social History Social History   Tobacco Use  . Smoking status: Current Every Day Smoker    Packs/day: 0.50    Years: 15.00    Pack years: 7.50    Types: Cigarettes  . Smokeless tobacco: Never Used  Substance Use Topics  . Alcohol use: Yes  . Drug use: Yes     Allergies   Patient has no known allergies.   Review of Systems Review of Systems  Constitutional: Negative.  Negative for chills and fever.  Musculoskeletal: Positive for arthralgias. Negative for back pain, joint swelling and neck pain.  Skin: Negative.  Negative for color change and wound.  Neurological: Negative.  Negative for weakness and numbness.   Physical Exam Updated Vital Signs BP 118/78 (BP Location: Right Arm)   Pulse 92   Temp 98.1 F (36.7 C) (Oral)   Resp 16   Ht 5\' 10"  (1.778 m)   Wt 117.9 kg   SpO2 99%   BMI 37.31 kg/m   Physical Exam  Constitutional: He is oriented to person, place, and time. He appears well-developed and well-nourished. No distress.  HENT:  Head: Normocephalic and atraumatic.  Right Ear: External ear normal.  Left Ear: External ear normal.  Nose: Nose normal.  Eyes: Pupils are equal, round, and reactive to light. EOM are normal.  Neck: Trachea normal, normal range of motion, full passive range of motion without pain and phonation normal. Neck supple. No spinous process tenderness and no muscular tenderness present. No tracheal deviation present.  Cardiovascular:  Pulses:      Radial pulses are 2+ on the right side, and 2+ on the left side.  Pulmonary/Chest: Effort normal. No respiratory distress.  Abdominal: Soft. There is no tenderness. There is no rebound and no guarding.  Musculoskeletal: Normal range of motion.       Right shoulder: He exhibits  tenderness. He exhibits no swelling, no crepitus and no deformity.       Right elbow: Normal.      Right wrist: Normal.       Cervical back: Normal.       Thoracic back: Normal.       Right hand: Normal. He exhibits normal capillary refill. Normal sensation noted. Normal strength noted.  Cervical Spine: Appearance normal. No obvious bony deformity. No skin swelling, erythema, heat, fluctuance or break of the skin. No TTP over the cervical spinous processes. No paraspinal tenderness. No step-offs. Patient is able to actively rotate their neck 45 degrees left and right voluntarily without pain and flex and extend the neck without pain.   Right Shoulder: Appearance normal. No obvious bony deformity. No skin swelling, erythema, heat, fluctuance or break of the skin. No clavicular deformity or TTP.  TTP diffusely over right anterior and lateral deltoid. Patient with reduced range of motion due to pain.  Strength for flexion, extension, abduction, adduction, and internal/external rotation intact and appropriate for age however patient will not raise arm above shoulder level, states due to pain.  Right Elbow: Appearance normal. No obvious bony deformity. No skin swelling, erythema, heat, fluctuance or break of the skin. No TTP over joint. Active flexion, extension, supination and pronation full and intact without pain. Strength able and appropriate for age for flexion and extension.  Radial Pulse 2+. Cap refill <2 seconds. SILT for M/U/R distributions. Compartments soft.   Neurological: He is alert and oriented to person, place, and time. No sensory deficit.  Skin: Skin is warm and dry. Capillary refill takes less than 2 seconds.  Psychiatric: He has a normal mood and affect. His behavior is normal.   ED Treatments / Results  Labs (all labs ordered are listed, but only abnormal results are displayed) Labs Reviewed - No data to display  EKG None  Radiology Dg Shoulder Right  Result Date:  01/31/2018 CLINICAL DATA:  Right shoulder pain chronically with recent lifting injury. EXAM: RIGHT SHOULDER - 2+ VIEW COMPARISON:  08/19/2017 FINDINGS: Mild degenerate change of the [facility] joint with moderate degenerative changes of the glenohumeral joint. Possible old humeral head fracture. No acute fracture or dislocation. IMPRESSION: Moderate degenerative change of the glenohumeral joint and mild degenerate change of the AC joint. No acute findings. Electronically Signed   By: Elberta Fortis M.D.   On: 01/31/2018 00:19    Procedures Procedures (including critical care time)  Medications Ordered in ED Medications  naproxen (NAPROSYN) tablet 500 mg (has no administration in time range)  lidocaine (LIDODERM) 5 % 1 patch (has no administration in time range)     Initial Impression / Assessment and Plan / ED Course  I have reviewed the triage vital signs and the nursing notes.  Pertinent labs & imaging results that were available during my care of the patient were reviewed by me and considered in my medical decision making (see chart for details).    32 year old male presenting for acute on chronic right shoulder pain after reported possible subluxation of right shoulder at work.  No obvious injury or sign of deformity today.  Normal-appearing shoulder.  Patient neurovascular intact to bilateral upper extremities.  Patient with reduced range of motion of shoulder due to pain.  No signs of infection present.  No signs of vascular compromise.  Suspect exacerbation of patient's chronic right shoulder pain.  Imaging today shows degenerative changes without acute findings.  Patient informed of x-ray findings.  Patient given orthopedic follow-up for further evaluation of right shoulder pain.  Patient given sling today for comfort, Lidoderm patch for comfort and naproxen.  Patient denies history of kidney disease or gastric bleeding.  States that naproxen works well for him in the past.  Patient  afebrile, not tachycardic, not hypotensive, well-appearing and in no acute distress.  At this time there does not appear to be any evidence of an acute emergency medical condition and the patient appears stable for discharge with appropriate outpatient follow up. Diagnosis was discussed with patient who verbalizes understanding of care plan and is agreeable to discharge. I have discussed return precautions with patient who verbalizes understanding of return precautions. Patient strongly encouraged to follow-up with their PCP. All questions answered.   Note: Portions of this report may have been transcribed using voice recognition software. Every effort was made to ensure accuracy; however, inadvertent computerized transcription errors may still be present. Final Clinical Impressions(s) / ED Diagnoses   Final diagnoses:  Right shoulder pain, unspecified chronicity    ED Discharge Orders         Ordered    lidocaine (LIDODERM) 5 %  Every 24 hours     01/31/18 0043           Bill Salinas, PA-C 01/31/18 1610    Zadie Rhine, MD 01/31/18 [phone]

## 2018-01-31 NOTE — ED Notes (Signed)
ED Provider at bedside. 

## 2018-01-31 NOTE — Discharge Instructions (Addendum)
Please return to the Emergency Department for any new or worsening symptoms or if your symptoms do not improve. Please be sure to follow up with your Primary Care Physician as soon as possible regarding your visit today. If you do not have a Primary Doctor please use the resources below to establish one. Your x-rays today showed chronic degenerative changes to your right shoulder.  Please follow-up with the orthopedic specialist Dr. Everardo Pacific regarding your right shoulder pain. You may use the Lidoderm patch as prescribed to help with your pain.  You may also use the sling to help with your pain, however be sure to maintain range of motion of your shoulder to avoid stiffness.  Contact a health care provider if: Your pain gets worse. Your pain is not relieved with medicines. New pain develops in your arm, hand, or fingers. Get help right away if: Your arm, hand, or fingers: Tingle. Become numb. Become swollen. Become painful. Turn white or blue.  Do not take your medicine if  develop an itchy rash, swelling in your mouth or lips, or difficulty breathing.   RESOURCE GUIDE  Chronic Pain Problems: Contact [facility]  [phone] Patients need to be referred by their primary care doctor.  Insufficient Money for Medicine: Contact United Way:  call "211" or [facility] (320)753-1715.  No Primary Care Doctor: Call [facility] Connect  [phone] - can help you locate a primary care doctor that  accepts your insurance, provides certain services, etc. Physician Referral Service- [phone]  Agencies that provide inexpensive medical care: [facility]  [phone] [facility]  [phone] [facility]  [phone] [facility]  [phone] Planned Parenthood  [phone] [facility]  [phone]  Medicaid-accepting [facility] Providers: [facility]- [address]  [phone], Mon-Fri  9am-7pm, Sat 9am-1pm [facility]- [address] [place], [address] [place]  [phone] [facility]- [address], [address] [place]  [phone] [facility]- [address]  [phone] Renaye Rakers- [address] [place], [address], [phone]  Only accepts [place] patients after they have their name  applied to their card  Self Pay (no insurance) in [facility]: Sickle Cell Patients: Dr Willey Blade, [facility]  [address] [place], [phone] [facility]- [address] [place]  [phone]       [facility] [place]- [address]       -     [facility]- see information above (Speak to [employer] if you do not have insurance)       -  [facility]- [address] [place], [phone]       -  [facility]- [address],  [phone]       -  [facility]- [address], [phone]       -  Dr Julio Sicks-  [address], [address], [place], [phone]       -  [facility]- [address] [phone]       -  [facility]- [address] [phone], also [address] [phone]       -    [facility]- [address] [place], [phone], 1st & 3rd Saturday   every month, 10am-1pm  1) Find a Librarian, academic and Pay Out of Pocket Although you won't have to find out who is covered by your insurance plan, it is a good idea to ask around and get recommendations. You will then need to call the office and see if the doctor you have chosen will accept you as a new patient and what types of options they offer for patients who are self-pay. Some doctors offer discounts or will set up payment plans for their patients who do not have insurance, but you will need to ask so you aren't surprised when you get to your appointment.  2) Contact Your Local Health Department Not all health departments have doctors that can see patients for  sick visits, but many do, so it is worth a call to see if yours does. If you don't know where your local health department is, you can check in your phone book. The CDC also has a tool to help you locate your state's health department, and many state websites also have listings of all of their local health departments.  3) Find a Walk-in Clinic If your illness is not likely to be very severe or complicated, you may want to try a walk in clinic. These are popping up all over the country in pharmacies, drugstores, and shopping centers. They're usually staffed by nurse practitioners or physician assistants that have been trained to treat common illnesses and complaints. They're usually fairly quick and inexpensive. However, if you have serious medical issues or chronic medical problems, these are probably not your best option  STD Testing [facility] Department of [facility], STD Clinic, [address] [place], phone [phone] or [phone].  Monday - Friday, call for an appointment. [facility] Department of [employer], STD Clinic, [address], [place], phone [phone] or [phone].  Monday - Friday, call for an appointment.  Abuse/Neglect: [facility] Child Abuse Hotline (902)744-3826 [facility] Child Abuse Hotline 647-464-8930 (After Hours)  Emergency Shelter:  [facility] [phone]  Maternity Homes: Room at the [place] [facility] [phone] [facility] 7056599724  MRSA Hotline #:   270-313-3253  [facility] Resources  [facility]  United Way [facility] Dept. [address]                 [address]         [address] [place] [address]  Blondell Reveal Phone:  [phone]                                  Phone:  [phone]                   Phone:   [phone]  [facility], [phone] [facility] Human [phone]       -     [facility] in [place], [place] [address]  [address]                                  [phone], [facility] Child Abuse Hotline 505-287-3544 or (470)266-8121 (After Hours)   Behavioral Health Services  Substance Abuse Resources: [facility] and Drug Services  940-101-7253 [facility] [phone] [facility] [place] [phone] Floydene Flock [phone] Residential & Outpatient Substance Abuse Program  [phone]  Psychological Services: [facility]  [phone] [facility]  [phone] [facility], [phone] [place]. [address], [place], ACCESS LINE: 850-175-5123 or [phone], [employer]an.co.za  Dental Assistance  If unable to pay or uninsured, contact:  [facility] or [facility]. to become qualified for the adult dental clinic.  Patients with Medicaid: [facility] [phone] [address], [phone] [address], [phone]  If unable to pay, or uninsured, contact [facility] [phone]) or [facility] Department [phone] in [place], [phone] in [facility]) to become qualified for the adult dental clinic   Other Low-Cost Community Dental Services: [facility]- [address] [place], [place], [place], [address], [phone], [phone], 2nd and 4th Thursday of the month at 6:30am.  10 clients each day by appointment, can sometimes see walk-in patients if someone does not show for an appointment. [facility]- [address] [place], [place], [address], [phone] [facility] [address] [place], [place], [address], [phone] [facility] Health Department- [phone] [facility] Health Department- [phone] [facility] [phone]

## 2018-01-31 NOTE — ED Notes (Signed)
Patient transported to X-ray 

## 2018-02-20 ENCOUNTER — Emergency Department (HOSPITAL_COMMUNITY)
Admission: EM | Admit: 2018-02-20 | Discharge: 2018-02-20 | Disposition: A | Discharge status: Home / Self Care | Payer: Self-pay | Attending: Emergency Medicine | Admitting: Emergency Medicine

## 2018-02-20 ENCOUNTER — Encounter (HOSPITAL_COMMUNITY): Payer: Self-pay

## 2018-02-20 ENCOUNTER — Other Ambulatory Visit: Payer: Self-pay

## 2018-02-20 DIAGNOSIS — M25511 Pain in right shoulder: Secondary | ICD-10-CM | POA: Insufficient documentation

## 2018-02-20 DIAGNOSIS — Z79899 Other long term (current) drug therapy: Secondary | ICD-10-CM | POA: Insufficient documentation

## 2018-02-20 DIAGNOSIS — F17210 Nicotine dependence, cigarettes, uncomplicated: Secondary | ICD-10-CM | POA: Insufficient documentation

## 2018-02-20 DIAGNOSIS — G8929 Other chronic pain: Secondary | ICD-10-CM | POA: Insufficient documentation

## 2018-02-20 MED ORDER — LIDOCAINE 5 % EX PTCH
1.0000 | MEDICATED_PATCH | CUTANEOUS | Status: DC
  Administered 2018-02-20: 1 via TRANSDERMAL
  Filled 2018-02-20: qty 1

## 2018-02-20 MED ORDER — NAPROXEN 250 MG PO TABS
250.0000 mg | ORAL_TABLET | Freq: Once | ORAL | Status: AC
  Administered 2018-02-20: 250 mg via ORAL
  Filled 2018-02-20: qty 1

## 2018-02-20 MED ORDER — LIDOCAINE 5 % EX PTCH: 1.0000 | MEDICATED_PATCH | CUTANEOUS | 0 refills | Status: DC

## 2018-02-20 NOTE — ED Triage Notes (Signed)
Pt c.o right shoulder pain, chronic since 2012. Pt states he thinks he slept on it wrong to make the pain flare up.

## 2018-02-20 NOTE — ED Notes (Signed)
Pt reports he had a rx for lidocaine patches that helped a lot for his pain but he lost it

## 2018-02-20 NOTE — Discharge Instructions (Signed)
You can take aleve, available over the counter twice daily for the next week.

## 2018-02-20 NOTE — ED Provider Notes (Signed)
[facility] EMERGENC[facility] Provider Note   CSN: [id number] Arrival date & time: 02/20/18  1808     History   Chief Complaint Chief Complaint  Patient presents with  . Shoulder Pain    HPI Ricci Oneta Racklford is a 32 y.o. male.  The history is provided by the patient. No language interpreter was used.  Shoulder Pain     [place] Oneta Racklford is a 32 y.o. male who presents to the Emergency Department complaining of shoulder pain. He has a history of seven years of right shoulder pain after an injury. He has intermittent flareups of pain in this shoulder. Over the last week he has had increased pain and swelling to the anterior and lateral aspect of the shoulder. 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 He was seen in the emergency department recently and received a prescription for lidocaine but misplaced the prescription. He is used in the past and seems to work well for his pain. He has tried Tylenol p.m. at home as well with only mild improvement. He denies any fevers, chills, chest pain, shortness of breath, abdominal pain. He is right-hand dominant. Past Medical History:  Diagnosis Date  . Arthritis   . Shoulder pain     There are no active problems to display for this patient.   History reviewed. No pertinent surgical history.      Home Medications    Prior to Admission medications   Medication Sig Start Date End Date Taking? Authorizing Provider  diclofenac sodium (VOLTAREN) 1 % GEL Apply 2 g topically 4 (four) times daily as needed. 11/17/17   Rise MuLeaphart, Kenneth T, PA-C  ibuprofen (ADVIL,MOTRIN) 200 MG tablet Take 200-400 mg by mouth every 4 (four) hours as needed for fever, headache, mild pain, moderate pain or cramping.    [provider]  lidocaine (LIDODERM) 5 % Place 1 patch onto the skin daily. Remove & Discard patch within 12 hours or as directed by MD 02/20/18   Tilden Fossaees, Sharnette Kitamura, MD  methocarbamol (ROBAXIN) 500 MG tablet Take 1 tablet (500 mg total)  by mouth 2 (two) times daily. 01/21/18   Khatri, Hina, PA-C  naproxen (NAPROSYN) 500 MG tablet Take 1 tablet (500 mg total) by mouth 2 (two) times daily. 12/13/17   Dietrich PatesKhatri, Hina, PA-C    Family History No family history on file.  Social History Social History   Tobacco Use  . Smoking status: Current Every Day Smoker    Packs/day: 0.50    Years: 15.00    Pack years: 7.50    Types: Cigarettes  . Smokeless tobacco: Never Used  Substance Use Topics  . Alcohol use: Yes  . Drug use: Yes     Allergies   Patient has no known allergies.   Review of Systems Review of Systems  All other systems reviewed and are negative.    Physical Exam Updated Vital Signs BP 120/77 (BP Location: Right Arm)   Pulse 82   Temp 98 F (36.7 C) (Oral)   Resp 19   Ht 5\' 10"  (1.778 m)   Wt 117.9 kg   SpO2 100%   BMI 37.29 kg/m   Physical Exam  Constitutional: He is oriented to person, place, and time. He appears well-developed and well-nourished.  HENT:  Head: Normocephalic and atraumatic.  Cardiovascular: Normal rate and regular rhythm.  No murmur heard. Pulmonary/Chest: Effort normal and breath sounds normal. No respiratory distress.  Abdominal: Soft. There is no tenderness. There is no rebound and no guarding.  Musculoskeletal:  2+ radial pulse in the right upper extremity. There is tenderness to palpation over the right upper and anterior and lateral shoulder and the deltoid region. Range of motion is limited secondary to pain, particularly on abduction. He is able to cross midline Foley. Five out of five grip strength. Sensation to light touch intact throughout the right upper extremity. No tenderness over the elbow, wrist or hand.  Neurological: He is alert and oriented to person, place, and time.  Skin: Skin is warm and dry.  Psychiatric: He has a normal mood and affect. His behavior is normal.  Nursing note and vitals reviewed.    ED Treatments / Results  Labs (all labs ordered  are listed, but only abnormal results are displayed) Labs Reviewed - No data to display  EKG None  Radiology No results found.  Procedures Procedures (including critical care time)  Medications Ordered in ED Medications  lidocaine (LIDODERM) 5 % 1 patch (has no administration in time range)  naproxen (NAPROSYN) tablet 250 mg (has no administration in time range)     Initial Impression / Assessment and Plan / ED Course  I have reviewed the triage vital signs and the nursing notes.  Pertinent labs & imaging results that were available during my care of the patient were reviewed by me and considered in my medical decision making (see chart for details).     Patient here for evaluation of acute on chronic right shoulder pain, request for Lidoderm patch refill. He is non-toxic appearing on examination. No clinical evidence of acute fracture, dislocation, septic arthritis. Counseled patient on home care for shoulder pain. Discussed outpatient follow-up and return precautions.  Final Clinical Impressions(s) / ED Diagnoses   Final diagnoses:  Chronic right shoulder pain    ED Discharge Orders         Ordered    lidocaine (LIDODERM) 5 %  Every 24 hours     02/20/18 1825           Tilden Fossa, MD 02/20/18 [phone]

## 2018-02-20 NOTE — ED Notes (Signed)
Pt stable, ambulatory, states understanding of discharge instructions 

## 2018-03-06 ENCOUNTER — Emergency Department (HOSPITAL_COMMUNITY)
Admission: EM | Admit: 2018-03-06 | Discharge: 2018-03-07 | Disposition: A | Discharge status: Home / Self Care | Payer: Self-pay | Attending: Emergency Medicine | Admitting: Emergency Medicine

## 2018-03-06 ENCOUNTER — Emergency Department (HOSPITAL_COMMUNITY): Payer: Self-pay

## 2018-03-06 ENCOUNTER — Encounter (HOSPITAL_COMMUNITY): Payer: Self-pay | Admitting: Emergency Medicine

## 2018-03-06 DIAGNOSIS — Y939 Activity, unspecified: Secondary | ICD-10-CM | POA: Insufficient documentation

## 2018-03-06 DIAGNOSIS — F17210 Nicotine dependence, cigarettes, uncomplicated: Secondary | ICD-10-CM | POA: Insufficient documentation

## 2018-03-06 DIAGNOSIS — S46911A Strain of unspecified muscle, fascia and tendon at shoulder and upper arm level, right arm, initial encounter: Secondary | ICD-10-CM | POA: Insufficient documentation

## 2018-03-06 DIAGNOSIS — Y33XXXA Other specified events, undetermined intent, initial encounter: Secondary | ICD-10-CM | POA: Insufficient documentation

## 2018-03-06 DIAGNOSIS — Y998 Other external cause status: Secondary | ICD-10-CM | POA: Insufficient documentation

## 2018-03-06 DIAGNOSIS — Y929 Unspecified place or not applicable: Secondary | ICD-10-CM | POA: Insufficient documentation

## 2018-03-06 NOTE — ED Triage Notes (Signed)
Pt reports R shoulder pain after misjudging a step and falling landing on R shoulder. No obvious deformity. Pt states hx of shoulder issues.

## 2018-03-07 MED ORDER — TRAMADOL HCL 50 MG PO TABS: 50.0000 mg | ORAL_TABLET | Freq: Four times a day (QID) | ORAL | 0 refills | Status: DC | PRN

## 2018-03-07 NOTE — ED Provider Notes (Signed)
[facility] EMERGENCY DEPARTMENT Provider Note   CSN: [id number] Arrival date & time: 03/06/18  2240     History   Chief Complaint Chief Complaint  Patient presents with  . Shoulder Pain    HPI Adam Ramsey is a 32 y.o. male.  Patient presents with complaints of right shoulder pain.  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  Patient reports that he stumbled when he was walking up a step and grabbed with his right arm to try and stabilize himself, further injuring his shoulder.  Patient reports a constant pain in the shoulder that worsens with any movement.     Past Medical History:  Diagnosis Date  . Arthritis   . Shoulder pain     There are no active problems to display for this patient.   History reviewed. No pertinent surgical history.      Home Medications    Prior to Admission medications   Medication Sig Start Date End Date Taking? Authorizing Provider  ibuprofen (ADVIL,MOTRIN) 200 MG tablet Take 200-400 mg by mouth every 4 (four) hours as needed for fever, headache, mild pain, moderate pain or cramping.    [provider]  lidocaine (LIDODERM) 5 % Place 1 patch onto the skin daily. Remove & Discard patch within 12 hours or as directed by MD 02/20/18   Tilden Fossa, MD  traMADol (ULTRAM) 50 MG tablet Take 1 tablet (50 mg total) by mouth every 6 (six) hours as needed. 03/07/18   Gilda Crease, MD    Family History No family history on file.  Social History Social History   Tobacco Use  . Smoking status: Current Every Day Smoker    Packs/day: 0.50    Years: 15.00    Pack years: 7.50    Types: Cigarettes  . Smokeless tobacco: Never Used  Substance Use Topics  . Alcohol use: Yes  . Drug use: Yes     Allergies   Patient has no known allergies.   Review of Systems Review of  Systems  Musculoskeletal: Positive for arthralgias.  Neurological: Negative.      Physical Exam Updated Vital Signs BP 135/86 (BP Location: Left Arm)   Pulse 71   Temp 97.9 F (36.6 C) (Oral)   Resp 18   Ht 5\' 10"  (1.778 m)   Wt 117.9 kg   SpO2 97%   BMI 37.31 kg/m   Physical Exam  Constitutional: He is oriented to person, place, and time. He appears well-developed and well-nourished.  HENT:  Head: Atraumatic.  Pulmonary/Chest: Effort normal.  Musculoskeletal:       Right shoulder: He exhibits decreased range of motion and tenderness. He exhibits no swelling, no effusion and no deformity.  Neurological: He is alert and oriented to person, place, and time. No sensory deficit. He exhibits normal muscle tone.  Skin: Skin is warm and dry.  Psychiatric: He has a normal mood and affect.     ED Treatments / Results  Labs (all labs ordered are listed, but only abnormal results are displayed) Labs Reviewed - No data to display  EKG None  Radiology Dg Shoulder Right  Result Date: 03/06/2018 CLINICAL DATA:  32 year old male with right shoulder pain. EXAM: RIGHT SHOULDER - 2+ VIEW COMPARISON:  Right shoulder radiograph dated 01/30/2018 FINDINGS: There is no acute fracture or dislocation. There  is moderate to advanced arthritic changes of the right shoulder irregularity and osteophyte of the glenohumeral joint. There is slight increase in the acromial humeral distance which may represent joint effusion. The soft tissues are unremarkable. IMPRESSION: 1. No acute fracture or dislocation. 2. Moderate to advanced arthritic changes of the right shoulder. Electronically Signed   By: Elgie CollardArash  Radparvar M.D.   On: 03/06/2018 23:29    Procedures Procedures (including critical care time)  Medications Ordered in ED Medications - No data to display   Initial Impression / Assessment and Plan / ED Course  I have reviewed the triage vital signs and the nursing notes.  Pertinent labs &  imaging results that were available during my care of the patient were reviewed by me and considered in my medical decision making (see chart for details).     Patient presents with right shoulder injury.  He has a history of chronic problems with his right shoulder but further injury to tonight when he had a near fall, causing him to grab with his right arm to stabilize himself while walking up steps.  X-ray does not show any acute abnormality.  Patient has tenderness and pain with range of motion, no neurologic deficit.  Patient placed in a sling, provided analgesia, will follow up with orthopedics.  Final Clinical Impressions(s) / ED Diagnoses   Final diagnoses:  Strain of right shoulder, initial encounter    ED Discharge Orders         Ordered    traMADol (ULTRAM) 50 MG tablet  Every 6 hours PRN     03/07/18 0044           Gilda CreasePollina,  J, MD 03/07/18 [phone]

## 2018-03-15 ENCOUNTER — Encounter (HOSPITAL_COMMUNITY): Payer: Self-pay | Admitting: *Deleted

## 2018-03-15 ENCOUNTER — Emergency Department (HOSPITAL_COMMUNITY)
Admission: EM | Admit: 2018-03-15 | Discharge: 2018-03-15 | Disposition: A | Discharge status: Home / Self Care | Payer: Self-pay | Attending: Emergency Medicine | Admitting: Emergency Medicine

## 2018-03-15 DIAGNOSIS — F17210 Nicotine dependence, cigarettes, uncomplicated: Secondary | ICD-10-CM | POA: Insufficient documentation

## 2018-03-15 DIAGNOSIS — M25511 Pain in right shoulder: Secondary | ICD-10-CM | POA: Insufficient documentation

## 2018-03-15 DIAGNOSIS — G8929 Other chronic pain: Secondary | ICD-10-CM | POA: Insufficient documentation

## 2018-03-15 MED ORDER — NAPROXEN 500 MG PO TABS: 500.0000 mg | ORAL_TABLET | Freq: Two times a day (BID) | ORAL | 0 refills | Status: DC | PRN

## 2018-03-15 MED ORDER — LIDOCAINE 5 % EX PTCH: 1.0000 | MEDICATED_PATCH | CUTANEOUS | 0 refills | Status: DC

## 2018-03-15 MED ORDER — NAPROXEN 500 MG PO TABS: 500.0000 mg | ORAL_TABLET | Freq: Two times a day (BID) | ORAL | 0 refills | Status: AC | PRN

## 2018-03-15 MED ORDER — KETOROLAC TROMETHAMINE 15 MG/ML IJ SOLN
15.0000 mg | Freq: Once | INTRAMUSCULAR | Status: AC
  Administered 2018-03-15: 15 mg via INTRAMUSCULAR
  Filled 2018-03-15: qty 1

## 2018-03-15 NOTE — ED Provider Notes (Signed)
[facility] Provider Note   CSN: [id number] Arrival date & time: 03/15/18  1158     History   Chief Complaint Chief Complaint  Patient presents with  . Shoulder Injury    HPI Adam Ramsey is a 32 y.o. male senting for evaluation of right shoulder pain.  Patient states he has chronic right shoulder pain.  Last night, he went to pick up something heavy and he had worsening of his chronic right shoulder pain.  Pain is constant, worse with movement and palpation.  Nothing makes better.  Tried Tylenol without improvement of his symptoms.  He has not taken anything else, stating he has nothing else at home.  He denies numbness or tingling.  He denies pain in his neck or back.  Patient states this feels the same as previous visits.  Patient states pain first began when he had a seizure many years ago.  Since then, he has had multiple flares.  Additional history obtained from chart review, patient has been seen 5 times in the past 2 months for the same.  He usually is discharged with NSAIDs or lidocaine patches.  Patient states these help.  Patient has not followed up with orthopedics, as he has been told to do.   HPI  Past Medical History:  Diagnosis Date  . Arthritis   . Shoulder pain     There are no active problems to display for this patient.   History reviewed. No pertinent surgical history.      Home Medications    Prior to Admission medications   Medication Sig Start Date End Date Taking? Authorizing Provider  ibuprofen (ADVIL,MOTRIN) 200 MG tablet Take 200-400 mg by mouth every 4 (four) hours as needed for fever, headache, mild pain, moderate pain or cramping.    [provider]  lidocaine (LIDODERM) 5 % Place 1 patch onto the skin daily. Remove & Discard patch within 12 hours or as directed by MD 03/15/18   [place], PA-C  naproxen (NAPROSYN) 500 MG tablet Take 1 tablet (500 mg total) by mouth 2 (two) times daily  as needed for up to 7 days. Take with meals 03/15/18 03/22/18  Espn Zeman, PA-C  traMADol (ULTRAM) 50 MG tablet Take 1 tablet (50 mg total) by mouth every 6 (six) hours as needed. 03/07/18   Gilda CreasePollina, Christopher J, MD    Family History History reviewed. No pertinent family history.  Social History Social History   Tobacco Use  . Smoking status: Current Every Day Smoker    Packs/day: 0.50    Years: 15.00    Pack years: 7.50    Types: Cigarettes  . Smokeless tobacco: Never Used  Substance Use Topics  . Alcohol use: Yes  . Drug use: Yes     Allergies   Patient has no known allergies.   Review of Systems Review of Systems  Musculoskeletal: Positive for arthralgias.  Neurological: Negative for numbness.  Hematological: Does not bruise/bleed easily.     Physical Exam Updated Vital Signs BP (!) 133/95 (BP Location: Right Arm)   Pulse 90   Temp 98 F (36.7 C) (Oral)   Resp 20   SpO2 100%   Physical Exam  Constitutional: He is oriented to person, place, and time. He appears well-developed and well-nourished. No distress.  HENT:  Head: Normocephalic and atraumatic.  Eyes: EOM are normal.  Neck: Normal range of motion.  Pulmonary/Chest: Effort normal.  Abdominal: He exhibits no distension.  Musculoskeletal: He  exhibits tenderness. He exhibits no deformity.  No obvious deformity of the right shoulder.  No significant swelling or contusions.  Tenderness palpation of the superior and anterior right shoulder.  Decreased passive range of motion due to pain, unable to extend past 90 degrees.  Grip strength intact bilaterally.  Radial pulses intact bilaterally.  Sensation intact bilaterally.  Good cap refill bilaterally.  Soft compartments.  Neurological: He is alert and oriented to person, place, and time.  Skin: Skin is warm. No rash noted.  Psychiatric: He has a normal mood and affect.  Nursing note and vitals reviewed.    ED Treatments / Results  Labs (all labs  ordered are listed, but only abnormal results are displayed) Labs Reviewed - No data to display  EKG None  Radiology No results found.  Procedures Procedures (including critical care time)  Medications Ordered in ED Medications  ketorolac (TORADOL) 15 MG/ML injection 15 mg (15 mg Intramuscular Given 03/15/18 1416)     Initial Impression / Assessment and Plan / ED Course  I have reviewed the triage vital signs and the nursing notes.  Pertinent labs & imaging results that were available during my care of the patient were reviewed by me and considered in my medical decision making (see chart for details).     Patient presenting for evaluation of acute on chronic right shoulder pain.  Patient has been seen frequently for the same in the past several months.  Discussed with patient important to follow-up with orthopedics, as I am concerned he will continue to have many ED visits for acute on chronic pain.  Will give Toradol shot today, discharged with lidocaine patches and naproxen, as this is helped in the past.  Patient given information for orthopedics. He was given a splint 7 days ago.   At this time, patient appears safe for discharge.  Return precautions given.  Patient states he understands agrees to plan.   Final Clinical Impressions(s) / ED Diagnoses   Final diagnoses:  Chronic right shoulder pain    ED Discharge Orders         Ordered    lidocaine (LIDODERM) 5 %  Every 24 hours     03/15/18 1412    naproxen (NAPROSYN) 500 MG tablet  2 times daily PRN,   Status:  Discontinued     03/15/18 1412    naproxen (NAPROSYN) 500 MG tablet  2 times daily PRN     03/15/18 1413           Cher Franzoni, PA-C 03/15/18 1519    Mancel Bale, MD 03/16/18 2109

## 2018-03-15 NOTE — ED Notes (Signed)
Patient verbalizes understanding of discharge instructions. Opportunity for questioning and answers were provided. Armband removed by staff, pt discharged from ED ambulatory to home.  

## 2018-03-15 NOTE — ED Triage Notes (Signed)
Pt in c/o right shoulder pain, states this is chronic but he picked up a heavy box at work last night and made pain worse, no distress noted

## 2018-03-15 NOTE — Discharge Instructions (Addendum)
It is very important to follow-up with orthopedics for further management of your shoulder. Take naproxen 2 times a day with meals.  Do not take other anti-inflammatories at the same time (Advil, Motrin, naproxen, Aleve). You may supplement with Tylenol if you need further pain control. Use ice packs or heating pads if this helps control your pain. Use lidocaine patches to help with pain. Return to the emergency room if any new, worsening, concerning symptoms.

## 2018-05-01 ENCOUNTER — Encounter (HOSPITAL_COMMUNITY): Payer: Self-pay

## 2018-05-01 ENCOUNTER — Emergency Department (HOSPITAL_COMMUNITY)
Admission: EM | Admit: 2018-05-01 | Discharge: 2018-05-01 | Disposition: A | Discharge status: Home / Self Care | Payer: Self-pay | Attending: Emergency Medicine | Admitting: Emergency Medicine

## 2018-05-01 DIAGNOSIS — M25511 Pain in right shoulder: Secondary | ICD-10-CM | POA: Insufficient documentation

## 2018-05-01 DIAGNOSIS — F17210 Nicotine dependence, cigarettes, uncomplicated: Secondary | ICD-10-CM | POA: Insufficient documentation

## 2018-05-01 DIAGNOSIS — G8929 Other chronic pain: Secondary | ICD-10-CM | POA: Insufficient documentation

## 2018-05-01 MED ORDER — LIDOCAINE 5 % EX PTCH: 1.0000 | MEDICATED_PATCH | CUTANEOUS | 0 refills | Status: DC

## 2018-05-01 MED ORDER — CYCLOBENZAPRINE HCL 10 MG PO TABS
10.0000 mg | ORAL_TABLET | Freq: Once | ORAL | Status: AC
  Administered 2018-05-01: 10 mg via ORAL
  Filled 2018-05-01: qty 1

## 2018-05-01 MED ORDER — IBUPROFEN 400 MG PO TABS
600.0000 mg | ORAL_TABLET | Freq: Once | ORAL | Status: AC
  Administered 2018-05-01: 600 mg via ORAL
  Filled 2018-05-01: qty 1

## 2018-05-01 MED ORDER — IBUPROFEN 600 MG PO TABS: 600.0000 mg | ORAL_TABLET | ORAL | 0 refills | Status: DC | PRN

## 2018-05-01 MED ORDER — CYCLOBENZAPRINE HCL 10 MG PO TABS: 10.0000 mg | ORAL_TABLET | Freq: Two times a day (BID) | ORAL | 0 refills | Status: DC | PRN

## 2018-05-01 NOTE — Discharge Instructions (Addendum)
Follow up with orthopedics for definitive treatment of chronic right shoulder pain.

## 2018-05-01 NOTE — ED Triage Notes (Signed)
H/o right shoulder pain.  Takes OTC meds and usually decreases pain but past several days pain is worse.

## 2018-05-01 NOTE — ED Provider Notes (Signed)
[facility]   CSN: [id number] Arrival date & time: 05/01/18  2211     History   Chief Complaint Chief Complaint  Patient presents with  . Shoulder Pain    HPI Adam Ramsey is a 33 y.o. male.  Patient to the ED with recurrent/persistent right anterior shoulder pain that has increased again over the last several days. No new injury. No numbness, weakness. He denies neck pain. He reports pain is consistent with chronic pain.  The history is provided by the patient. No language interpreter was used.  Shoulder Pain    Past Medical History:  Diagnosis Date  . Arthritis   . Shoulder pain     There are no active problems to display for this patient.   History reviewed. No pertinent surgical history.      Home Medications    Prior to Admission medications   Medication Sig Start Date End Date Taking? Authorizing Provider  ibuprofen (ADVIL,MOTRIN) 200 MG tablet Take 200-400 mg by mouth every 4 (four) hours as needed for fever, headache, mild pain, moderate pain or cramping.    [provider]  lidocaine (LIDODERM) 5 % Place 1 patch onto the skin daily. Remove & Discard patch within 12 hours or as directed by MD 03/15/18   Caccavale, Sophia, PA-C  traMADol (ULTRAM) 50 MG tablet Take 1 tablet (50 mg total) by mouth every 6 (six) hours as needed. 03/07/18   Gilda CreasePollina, Christopher J, MD    Family History History reviewed. No pertinent family history.  Social History Social History   Tobacco Use  . Smoking status: Current Every Day Smoker    Packs/day: 0.50    Years: 15.00    Pack years: 7.50    Types: Cigarettes  . Smokeless tobacco: Never Used  Substance Use Topics  . Alcohol use: Yes  . Drug use: Yes     Allergies   Patient has no known allergies.   Review of Systems Review of Systems  Musculoskeletal:       See HPI  Skin: Negative.  Negative for color change and wound.  Neurological: Negative.   Negative for weakness and numbness.     Physical Exam Updated Vital Signs BP 129/76   Pulse 87   Temp 98 F (36.7 C) (Oral)   Resp 16   SpO2 97%   Physical Exam Constitutional:      Appearance: He is well-developed.  Neck:     Musculoskeletal: Normal range of motion.  Pulmonary:     Effort: Pulmonary effort is normal.  Musculoskeletal:     Comments: Limited ROM right shoulder secondary to pain. 5/5 grip strength. No significant swelling and no bony deformity. Distal pulses are present.   Skin:    General: Skin is warm and dry.  Neurological:     Mental Status: He is alert and oriented to person, place, and time.     Sensory: No sensory deficit.      ED Treatments / Results  Labs (all labs ordered are listed, but only abnormal results are displayed) Labs Reviewed - No data to display  EKG None  Radiology No results found.  Procedures Procedures (including critical care time)  Medications Ordered in ED Medications - No data to display   Initial Impression / Assessment and Plan / ED Course  I have reviewed the triage vital signs and the nursing notes.  Pertinent labs & imaging results that were available during my care of the  patient were reviewed by me and considered in my medical decision making (see chart for details).     Patient to ED with acute on chronic right shoulder pain. No new injury. No significant exam deficits. No concern for radicular deficits.   Patient given orthopedic referral for definitive treatment. Rx: flexeril, lidoderm patch, ibuprofen.   Final Clinical Impressions(s) / ED Diagnoses   Final diagnoses:  None   1. Acute on chronic right shoulder pain ED Discharge Orders    None       Danne Harbor 05/01/18 2302    Loren Racer, MD 05/02/18 2308

## 2018-05-08 ENCOUNTER — Emergency Department (HOSPITAL_COMMUNITY)
Admission: EM | Admit: 2018-05-08 | Discharge: 2018-05-09 | Disposition: A | Discharge status: Home / Self Care | Payer: Self-pay | Attending: Emergency Medicine | Admitting: Emergency Medicine

## 2018-05-08 ENCOUNTER — Emergency Department (HOSPITAL_COMMUNITY): Payer: Self-pay

## 2018-05-08 DIAGNOSIS — M25511 Pain in right shoulder: Secondary | ICD-10-CM | POA: Insufficient documentation

## 2018-05-08 DIAGNOSIS — F17210 Nicotine dependence, cigarettes, uncomplicated: Secondary | ICD-10-CM | POA: Insufficient documentation

## 2018-05-08 DIAGNOSIS — Z79899 Other long term (current) drug therapy: Secondary | ICD-10-CM | POA: Insufficient documentation

## 2018-05-08 NOTE — ED Triage Notes (Signed)
Pt reports R shoulder pain onset PTA. Pt states he slipped and fell at work "landing on it wrong" Pt states he already has a hx of issues with the same shoulder. No obvious deformity noted.

## 2018-05-09 MED ORDER — METHOCARBAMOL 500 MG PO TABS: 500.0000 mg | ORAL_TABLET | Freq: Three times a day (TID) | ORAL | 0 refills | Status: DC | PRN

## 2018-05-09 NOTE — Discharge Instructions (Addendum)
You may alternate Tylenol 1000 mg every 6 hours as needed for pain and Ibuprofen 800 mg every 8 hours as needed for pain.  Please take Ibuprofen with food.  Please follow-up with your orthopedic physician as scheduled in 2 weeks.  I recommend that you use your sling at all times.

## 2018-05-09 NOTE — ED Notes (Signed)
Patient verbalizes understanding of discharge instructions. Opportunity for questioning and answers were provided. Armband removed by staff, pt discharged from ED ambulatory.   

## 2018-05-09 NOTE — ED Provider Notes (Signed)
TIME SEEN: 1:19 AM  CHIEF COMPLAINT: Right shoulder pain  HPI: Patient is a 33 year old right-hand-dominant male with history of chronic right shoulder pain who presents to the emergency department with right shoulder pain.  Has been seen in the ED 9 times in the past 6 months for the same.  States today he was at work when he slipped and fell onto his right side.  Did not hit his head or lose consciousness.  No neck or back pain.  States he has follow-up with orthopedics in 2 weeks.  ROS: See HPI Constitutional: no fever  Eyes: no drainage  ENT: no runny nose   Cardiovascular:  no chest pain  Resp: no SOB  GI: no vomiting GU: no dysuria Integumentary: no rash  Allergy: no hives  Musculoskeletal: no leg swelling  Neurological: no slurred speech ROS otherwise negative  PAST MEDICAL HISTORY/PAST SURGICAL HISTORY:  Past Medical History:  Diagnosis Date  . Arthritis   . Shoulder pain     MEDICATIONS:  Prior to Admission medications   Medication Sig Start Date End Date Taking? Authorizing Provider  cyclobenzaprine (FLEXERIL) 10 MG tablet Take 1 tablet (10 mg total) by mouth 2 (two) times daily as needed for muscle spasms. 05/01/18   Elpidio Anis, PA-C  ibuprofen (ADVIL,MOTRIN) 600 MG tablet Take 1 tablet (600 mg total) by mouth every 4 (four) hours as needed for moderate pain. 05/01/18   Elpidio Anis, PA-C  lidocaine (LIDODERM) 5 % Place 1 patch onto the skin daily. Remove & Discard patch within 12 hours or as directed by MD 05/01/18   Elpidio Anis, PA-C  traMADol (ULTRAM) 50 MG tablet Take 1 tablet (50 mg total) by mouth every 6 (six) hours as needed. 03/07/18   Gilda Crease, MD    ALLERGIES:  No Known Allergies  SOCIAL HISTORY:  Social History   Tobacco Use  . Smoking status: Current Every Day Smoker    Packs/day: 0.50    Years: 15.00    Pack years: 7.50    Types: Cigarettes  . Smokeless tobacco: Never Used  Substance Use Topics  . Alcohol use: Yes     FAMILY HISTORY: No family history on file.  EXAM: BP 138/78   Pulse 88   Temp (!) 97.5 F (36.4 C) (Oral)   Resp 18   SpO2 100%  CONSTITUTIONAL: Alert and oriented and responds appropriately to questions. Well-appearing; well-nourished HEAD: Normocephalic atraumatic EYES: Conjunctivae clear, pupils appear equal, EOMI ENT: normal nose; moist mucous membranes NECK: Supple, no meningismus, no nuchal rigidity, no LAD; no midline spinal tenderness or step-off or deformity CARD: RRR; S1 and S2 appreciated; no murmurs, no clicks, no rubs, no gallops RESP: Normal chest excursion without splinting or tachypnea; breath sounds clear and equal bilaterally; no wheezes, no rhonchi, no rales, no hypoxia or respiratory distress, speaking full sentences ABD/GI: Normal bowel sounds; non-distended; soft, non-tender, no rebound, no guarding, no peritoneal signs, no hepatosplenomegaly BACK:  The back appears normal and is non-tender to palpation, there is no CVA tenderness but no midline spinal tenderness or step-off or deformity EXT: Tender to palpation diffusely throughout the right shoulder without obvious deformity.  Decreased active range of motion in the shoulder but I am able to passively move it normally.  No sign of dislocation.  Normal grip strength in the right hand 2+ right radial pulse.  Normal sensation throughout the right arm.  Otherwise normal ROM in all joints; otherwise extremities are non-tender to palpation; no edema; normal  capillary refill; no cyanosis, no calf tenderness or swelling    SKIN: Normal color for age and race; warm; no rash NEURO: Moves all extremities equally PSYCH: The patient's mood and manner are appropriate. Grooming and personal hygiene are appropriate.  MEDICAL DECISION MAKING: Patient here with mechanical fall with right shoulder pain.  X-ray shows no acute abnormality.  Has chronic shoulder pain and previously seen subluxation but no dislocation.  I am able to  passively range his shoulder normally.  He is neurovascular intact distally.  Reports he has a sling at home.  Have advised him to use it.  Recommended alternating Tylenol and Motrin.  He is having some spasm of the trapezius muscle.  Will discharge with short course of Flexeril.  He was given Lidoderm patches during his last visit and I have recommended he continue these with ice.  Discussed return precautions.  Patient comfortable with this plan.  No other sign of trauma on exam.  At this time, I do not feel there is any life-threatening condition present. I have reviewed and discussed all results (EKG, imaging, lab, urine as appropriate) and exam findings with patient/family. I have reviewed nursing notes and appropriate previous records.  I feel the patient is safe to be discharged home without further emergent workup and can continue workup as an outpatient as needed. Discussed usual and customary return precautions. Patient/family verbalize understanding and are comfortable with this plan.  Outpatient follow-up has been provided as needed. All questions have been answered.      , Layla Maw, DO 05/09/18 [phone]

## 2018-06-19 ENCOUNTER — Other Ambulatory Visit: Payer: Self-pay

## 2018-06-19 ENCOUNTER — Encounter (HOSPITAL_BASED_OUTPATIENT_CLINIC_OR_DEPARTMENT_OTHER): Payer: Self-pay | Admitting: *Deleted

## 2018-06-19 ENCOUNTER — Emergency Department (HOSPITAL_BASED_OUTPATIENT_CLINIC_OR_DEPARTMENT_OTHER)
Admission: EM | Admit: 2018-06-19 | Discharge: 2018-06-19 | Disposition: A | Discharge status: Home / Self Care | Payer: Self-pay | Attending: Emergency Medicine | Admitting: Emergency Medicine

## 2018-06-19 DIAGNOSIS — F17210 Nicotine dependence, cigarettes, uncomplicated: Secondary | ICD-10-CM | POA: Insufficient documentation

## 2018-06-19 DIAGNOSIS — M25511 Pain in right shoulder: Secondary | ICD-10-CM | POA: Insufficient documentation

## 2018-06-19 HISTORY — DX: Epilepsy, unspecified, not intractable, without status epilepticus: G40.909

## 2018-06-19 MED ORDER — NAPROXEN 500 MG PO TABS: 500.0000 mg | ORAL_TABLET | Freq: Two times a day (BID) | ORAL | 0 refills | Status: DC

## 2018-06-19 MED ORDER — LIDOCAINE 5 % EX PTCH: 1.0000 | MEDICATED_PATCH | CUTANEOUS | 0 refills | Status: DC

## 2018-06-19 MED ORDER — METHOCARBAMOL 500 MG PO TABS: 500.0000 mg | ORAL_TABLET | Freq: Three times a day (TID) | ORAL | 0 refills | Status: DC | PRN

## 2018-06-19 NOTE — ED Provider Notes (Signed)
[facility] EMERGENCY DEPARTMENT Provider Note   CSN: [id number] Arrival date & time: 06/19/18  1638    History   Chief Complaint Chief Complaint  Patient presents with  . Shoulder Pain    HPI Adam Ramsey is a 33 y.o. male with a history of tobacco abuse, epilepsy, and chronic right shoulder pain who presents to the emergency department with acute on chronic right shoulder pain which started today.  Patient states he has a history of arthritis related to a prior injury several years ago, he notes that he has some baseline discomfort every day, however he does have episodes of increased pain.  He states that this episode of increased pain began this morning, pain is constant, worse with movement, no alleviating factors.  Tried 400 mg of Motrin prior to arrival without relief. Denies recent injury, change in activity, change in color, swelling, fever, numbness, or weakness.  No prior surgeries to the shoulder.    HPI  Past Medical History:  Diagnosis Date  . Arthritis   . Epilepsia [facility])    last seizure May 2016  . Shoulder pain     There are no active problems to display for this patient.   History reviewed. No pertinent surgical history.      Home Medications    Prior to Admission medications   Medication Sig Start Date End Date Taking? Authorizing Provider  cyclobenzaprine (FLEXERIL) 10 MG tablet Take 1 tablet (10 mg total) by mouth 2 (two) times daily as needed for muscle spasms. 05/01/18   Elpidio Anis, PA-C  ibuprofen (ADVIL,MOTRIN) 600 MG tablet Take 1 tablet (600 mg total) by mouth every 4 (four) hours as needed for moderate pain. 05/01/18   Elpidio Anis, PA-C  lidocaine (LIDODERM) 5 % Place 1 patch onto the skin daily. Remove & Discard patch within 12 hours or as directed by MD 05/01/18   Elpidio Anis, PA-C  methocarbamol (ROBAXIN) 500 MG tablet Take 1 tablet (500 mg total) by mouth every 8 (eight) hours as needed for muscle spasms. 05/09/18   Ward,  Layla Maw, DO  traMADol (ULTRAM) 50 MG tablet Take 1 tablet (50 mg total) by mouth every 6 (six) hours as needed. 03/07/18   Gilda Crease, MD    Family History No family history on file.  Social History Social History   Tobacco Use  . Smoking status: Current Every Day Smoker    Packs/day: 0.50    Years: 15.00    Pack years: 7.50    Types: Cigarettes  . Smokeless tobacco: Never Used  Substance Use Topics  . Alcohol use: Yes    Comment: occasional  . Drug use: Not Currently      Allergies   Patient has no known allergies.   Review of Systems Review of Systems  Constitutional: Negative for chills and fever.  Respiratory: Negative for shortness of breath.   Cardiovascular: Negative for chest pain.  Musculoskeletal: Positive for arthralgias. Negative for neck pain.  Skin: Negative for color change, pallor, rash and wound.  Neurological: Negative for weakness and numbness.     Physical Exam Updated Vital Signs BP 113/85 (BP Location: Left Arm)   Pulse 67   Temp 98.1 F (36.7 C) (Oral)   Resp 18   Ht 5\' 10"  (1.778 m)   Wt 122.5 kg   SpO2 100%   BMI 38.74 kg/m   Physical Exam Vitals signs and nursing note reviewed.  Constitutional:      General: He is  not in acute distress.    Appearance: Normal appearance. He is not ill-appearing or toxic-appearing.  HENT:     Head: Normocephalic and atraumatic.  Neck:     Musculoskeletal: Normal range of motion and neck supple.     Comments: No midline tenderness.  Cardiovascular:     Rate and Rhythm: Normal rate.     Pulses:          Radial pulses are 2+ on the right side and 2+ on the left side.  Pulmonary:     Effort: No respiratory distress.     Breath sounds: Normal breath sounds.  Musculoskeletal:     Comments: Upper extremities: No obvious deformity, appreciable swelling, edema, erythema, ecchymosis, warmth, or open wounds. Patient has intact AROM throughout the left upper extremity, right upper  extremity with full active range of motion throughout with the exception of the right shoulder.  Patient able to fully extend, he is able to flex, Abduct, perform scaption to approximately 90 degrees, limited passive secondary to pain.. Tender to palpation diffusely to the glenohumeral joint as well as the SITS muscles, & trapezius.  No point/focal bony tenderness.  Upper extremities are otherwise nontender.  Neurovascularly intact distally.  Skin:    General: Skin is warm and dry.     Capillary Refill: Capillary refill takes less than 2 seconds.  Neurological:     Mental Status: He is alert.     Comments: Alert. Clear speech. Sensation grossly intact to bilateral upper extremities. 5/5 symmetric grip strength. Ambulatory.  Able to perform okay sign, thumbs up, and cross second/third digits bilaterally.  Psychiatric:        Mood and Affect: Mood normal.        Behavior: Behavior normal.      ED Treatments / Results  Labs (all labs ordered are listed, but only abnormal results are displayed) Labs Reviewed - No data to display  EKG None  Radiology No results found.  Procedures Procedures (including critical care time)  Medications Ordered in ED Medications - No data to display   Initial Impression / Assessment and Plan / ED Course  I have reviewed the triage vital signs and the nursing notes.  Pertinent labs & imaging results that were available during my care of the patient were reviewed by me and considered in my medical decision making (see chart for details).   Patient presents to the emergency department with acute on chronic atraumatic right shoulder pain.  Patient nontoxic-appearing, in no apparent distress, vitals WNL.  Patient is afebrile, no erythema/warmth, doubt infectious process such as septic joint, cellulitis, or osteomyelitis.  Reproducible with range of motion and palpation, no chest pain/dyspnea, doubt acute cardiopulmonary process.  Seems musculoskeletal.   Atraumatic, low suspicion for fracture/dislocation, x-ray offered- patient declined, I am in agreement with this.  Will treat with naproxen, Robaxin, and Lidoderm patches, discussed with patient not to drive or operate heavy machinery when taking Robaxin.  We will have him follow-up with sports medicine. I discussed treatment plan, need for follow-up, and return precautions with the patient. Provided opportunity for questions, patient confirmed understanding and is in agreement with plan.    Final Clinical Impressions(s) / ED Diagnoses   Final diagnoses:  Right shoulder pain, unspecified chronicity    ED Discharge Orders         Ordered    naproxen (NAPROSYN) 500 MG tablet  2 times daily     06/19/18 1754    methocarbamol (ROBAXIN) 500 MG  tablet  Every 8 hours PRN     06/19/18 1754    lidocaine (LIDODERM) 5 %  Every 24 hours     06/19/18 1754           Donika Butner, Pleas Koch, PA-C 06/19/18 1755    Little, Ambrose Finland, MD 06/19/18 2010

## 2018-06-19 NOTE — ED Triage Notes (Signed)
Pt reports injury to right shoulder 7 years ago and has chronic pain. Today pain is worse than usual. Denies new injury.

## 2018-06-19 NOTE — Discharge Instructions (Signed)
You were seen in the emergency department for shoulder pain.  We have prescribed you lidoderm, anti-inflammatory medication and a muscle relaxer.  - Naproxen is a nonsteroidal anti-inflammatory medication that will help with pain and swelling. Be sure to take this medication as prescribed with food, 1 pill every 12 hours,  It should be taken with food, as it can cause stomach upset, and more seriously, stomach bleeding. Do not take other nonsteroidal anti-inflammatory medications with this such as Advil, Motrin, Aleve, Mobic, Goodie Powder, or Motrin.    - Robaxin is the muscle relaxer I have prescribed, this is meant to help with muscle tightness. Be aware that this medication may make you drowsy therefore the first time you take this it should be at a time you are in an environment where you can rest. Do not drive or operate heavy machinery when taking this medication. Do not drink alcohol or take other sedating medications with this medicine such as narcotics or benzodiazepines.   - Lidoderm patch-   You make take Tylenol per over the counter dosing with these medications.   We have prescribed you new medication(s) today. Discuss the medications prescribed today with your pharmacist as they can have adverse effects and interactions with your other medicines including over the counter and prescribed medications. Seek medical evaluation if you start to experience new or abnormal symptoms after taking one of these medicines, seek care immediately if you start to experience difficulty breathing, feeling of your throat closing, facial swelling, or rash as these could be indications of a more serious allergic reaction   The application of heat can help soothe the pain.  Maintaining your daily activities, including walking, is encourged, as it will help you get better faster than just staying in bed.  Your pain should get better over the next 2 weeks.  You will need to follow up with  Your primary  healthcare provider in 1-2 weeks for reassessment, if you do not have a primary care provider one is provided in your discharge instructions- you may see the [facility] or call the provided phone number. However return to the ER should you develop ne or worsening symptoms or any other concerns including but not limited to severe or worsening pain, low back pain with fever, numbness, weakness, loss of bowel or bladder control, or inability to walk or urinate, you should return to the ER immediately.

## 2018-06-19 NOTE — ED Notes (Signed)
Pt has hx of arthritis in right shoulder and states pain has increased states he took 400 mg ibuprofen for pain with little relief. Denies past surgery, no injury, denies numbness or tingling. Pt states pain increases with movement.

## 2018-07-20 IMAGING — DX DG CHEST 2V
2 series · 2 of 2 positions shown · non-contrast
Comparison: None.

CLINICAL DATA: 31 y/o M; cough, congestion, chest pain, back pain,
and shortness of breath for 3 days.

EXAM:
CHEST - 2 VIEW

[chest pa]
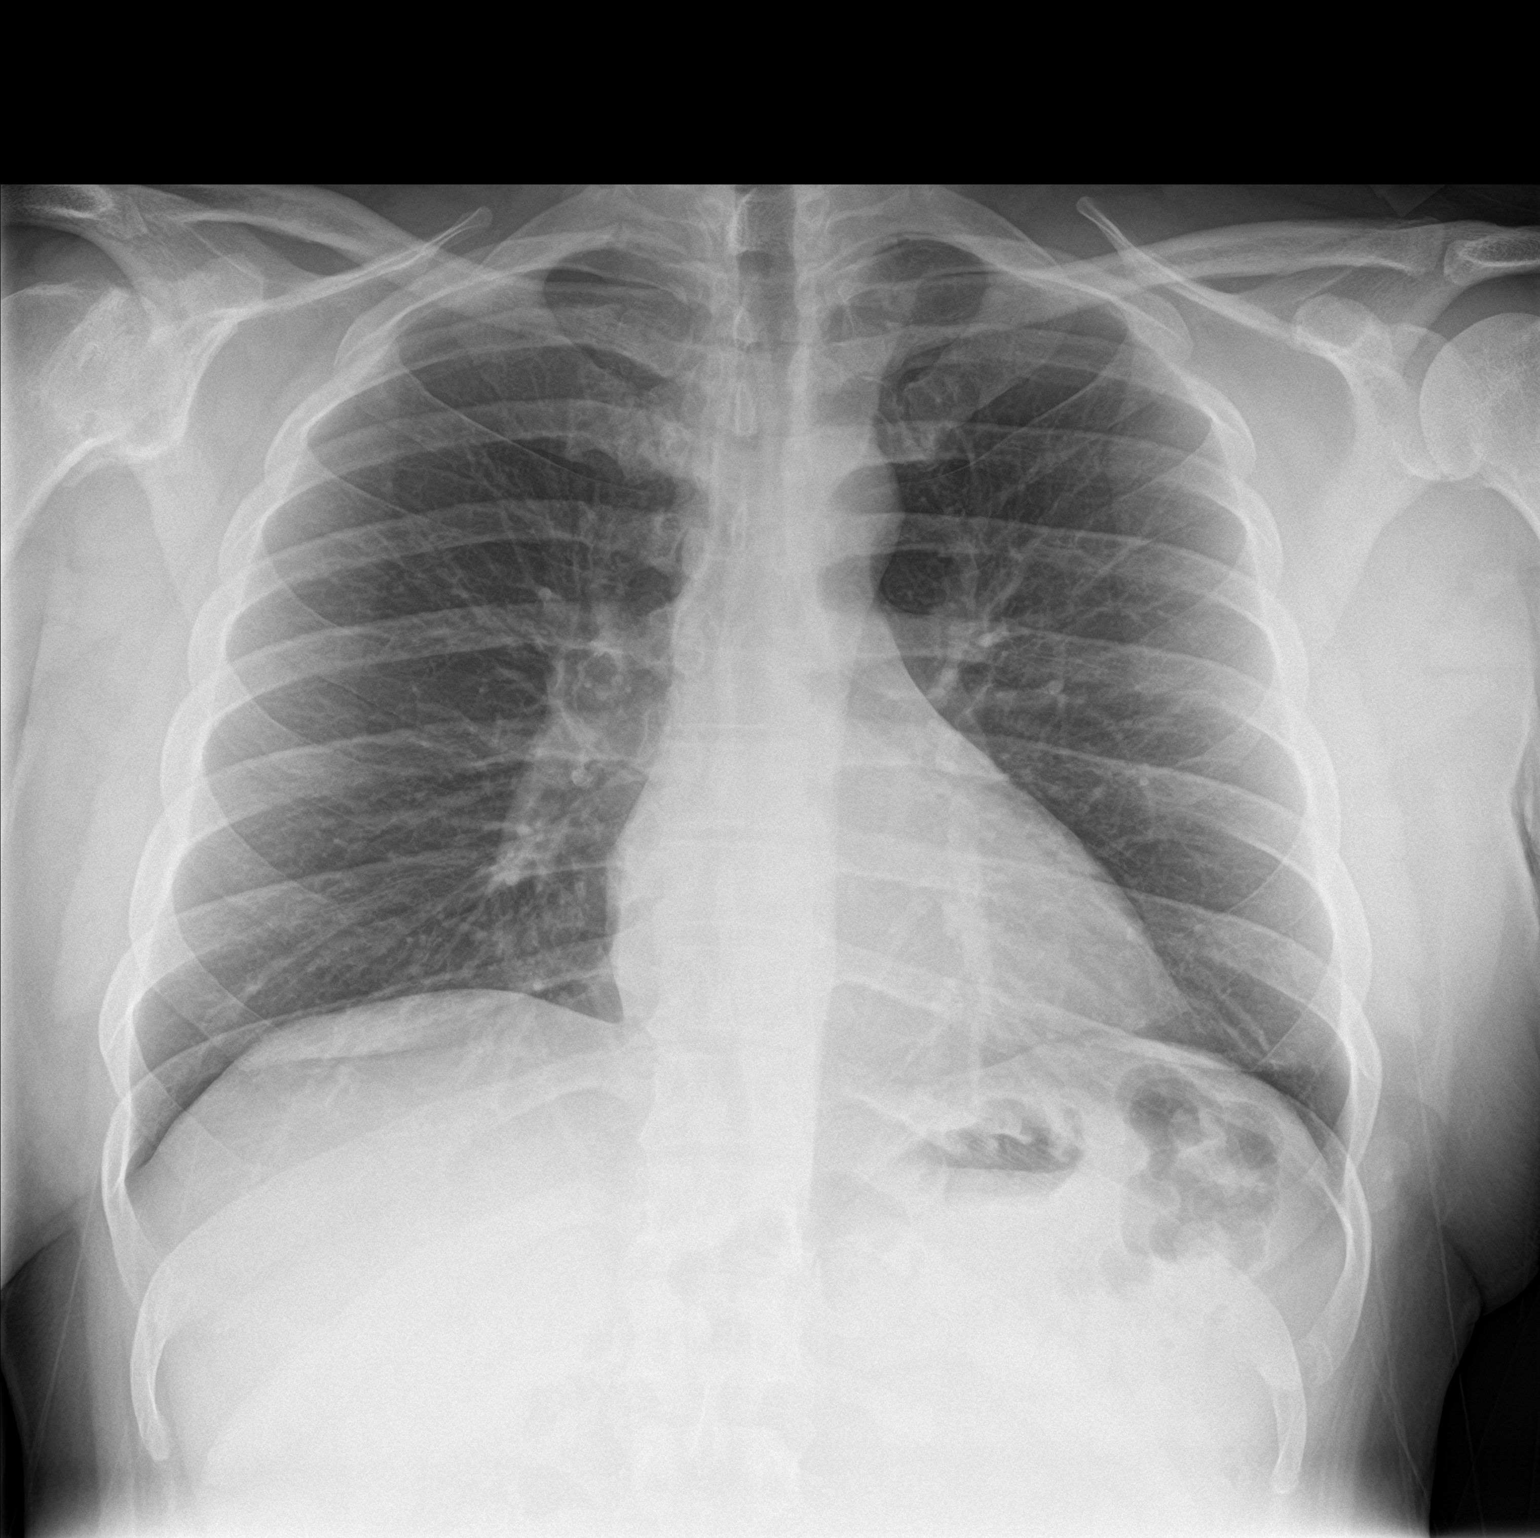

[chest lat]
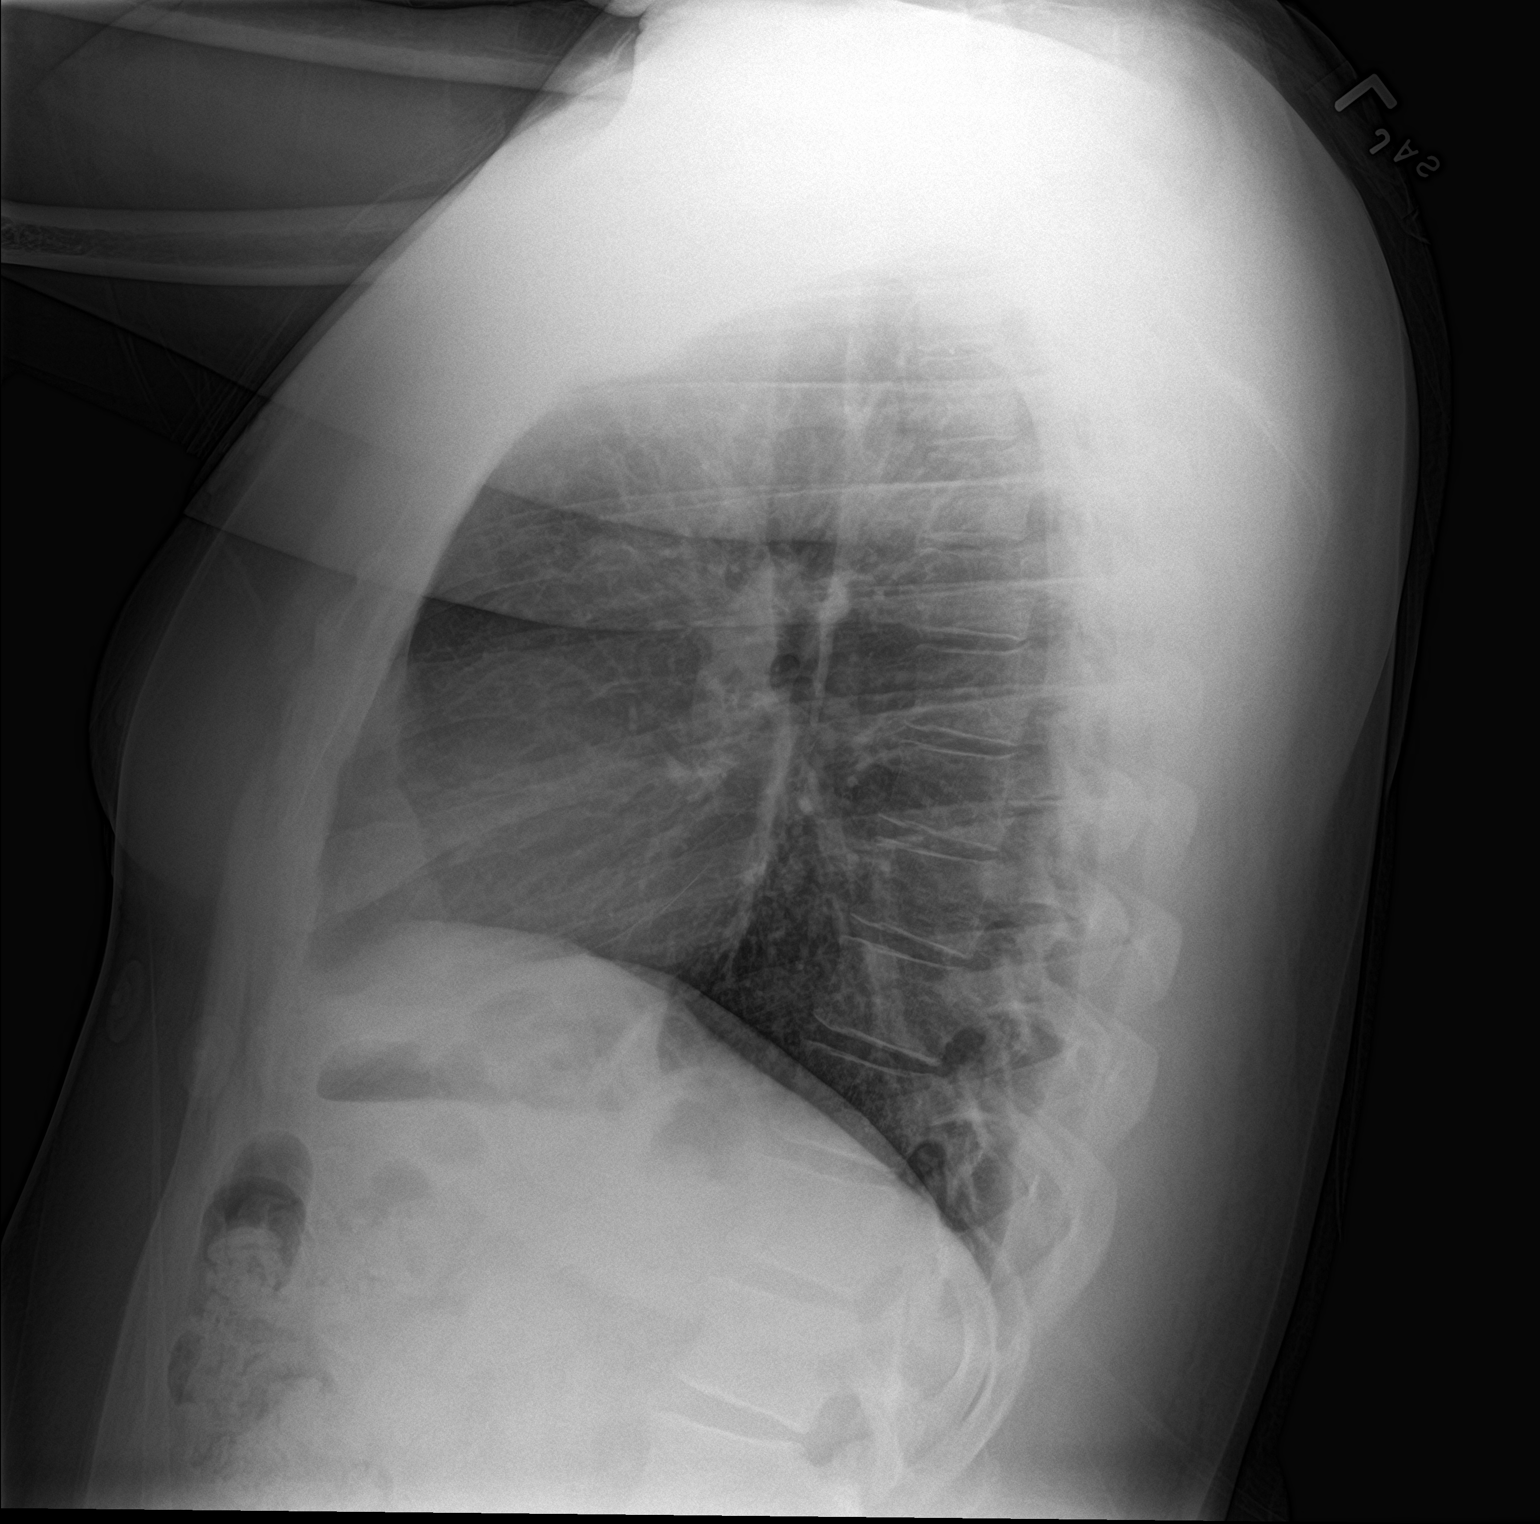

[2 of 2 positions shown; findings below may reference images not displayed]

FINDINGS: The heart size and mediastinal contours are within normal limits.
Both lungs are clear. The visualized skeletal structures are
unremarkable.
IMPRESSION: No acute pulmonary process identified.

By: Hamemi [place] M.D.

## 2018-10-23 ENCOUNTER — Encounter (HOSPITAL_BASED_OUTPATIENT_CLINIC_OR_DEPARTMENT_OTHER): Payer: Self-pay

## 2018-10-23 ENCOUNTER — Other Ambulatory Visit: Payer: Self-pay

## 2018-10-23 ENCOUNTER — Emergency Department (HOSPITAL_BASED_OUTPATIENT_CLINIC_OR_DEPARTMENT_OTHER)
Admission: EM | Admit: 2018-10-23 | Discharge: 2018-10-24 | Disposition: A | Discharge status: Home / Self Care | Payer: Self-pay | Attending: Emergency Medicine | Admitting: Emergency Medicine

## 2018-10-23 DIAGNOSIS — Z79899 Other long term (current) drug therapy: Secondary | ICD-10-CM | POA: Insufficient documentation

## 2018-10-23 DIAGNOSIS — M25511 Pain in right shoulder: Secondary | ICD-10-CM | POA: Insufficient documentation

## 2018-10-23 DIAGNOSIS — F17210 Nicotine dependence, cigarettes, uncomplicated: Secondary | ICD-10-CM | POA: Insufficient documentation

## 2018-10-23 NOTE — ED Triage Notes (Signed)
Pt has chronic R shoulder pain. Today pt lifted something at work that was heavy and had a sudden worsening of pain to R shoulder. Pt states he felt like he temporarily dislocated it.

## 2018-10-24 MED ORDER — METHOCARBAMOL 750 MG PO TABS: 750.0000 mg | ORAL_TABLET | Freq: Three times a day (TID) | ORAL | 0 refills | Status: DC | PRN

## 2018-10-24 MED ORDER — IBUPROFEN 600 MG PO TABS: 600.0000 mg | ORAL_TABLET | Freq: Three times a day (TID) | ORAL | 0 refills | Status: DC | PRN

## 2018-10-24 MED ORDER — IBUPROFEN 400 MG PO TABS
400.0000 mg | ORAL_TABLET | Freq: Once | ORAL | Status: AC
  Administered 2018-10-24: 400 mg via ORAL
  Filled 2018-10-24: qty 1

## 2018-10-24 MED ORDER — ACETAMINOPHEN 325 MG PO TABS
650.0000 mg | ORAL_TABLET | Freq: Once | ORAL | Status: AC
  Administered 2018-10-24: 650 mg via ORAL
  Filled 2018-10-24: qty 2

## 2018-10-24 NOTE — ED Provider Notes (Signed)
[facility] EMERGENCY DEPARTMENT Provider Note   CSN: [id number] Arrival date & time: 10/23/18  2256     History   Chief Complaint Chief Complaint  Patient presents with  . Shoulder Pain    HPI Adam Ramsey is a 33 y.o. male.     Patient c/o right shoulder pain. Pt notes hx chronic, intermittent right shoulder pain, and arthritis in shoulder. States came directly from work, drove self. States did lifting something fast at work., felt as if shoulder slipped out of place and back into place. Denies direct trauma or fall. No neck pain or radicular pain. No arm swelling. No fever or chills. Pain episodic, persistent, recurrent, constant now, non radiating, moderate, worse w certain movements. No arm numbness or weakness. No loss of normal function.   The history is provided by the patient.  Shoulder Pain Associated symptoms: no fever and no neck pain     Past Medical History:  Diagnosis Date  . Arthritis   . Epilepsia [facility])    last seizure May 2016  . Shoulder pain     There are no active problems to display for this patient.   History reviewed. No pertinent surgical history.      Home Medications    Prior to Admission medications   Medication Sig Start Date End Date Taking? Authorizing Provider  lidocaine (LIDODERM) 5 % Place 1 patch onto the skin daily. Remove & Discard patch within 12 hours or as directed by MD 06/19/18   Petrucelli, Pleas KochSamantha R, PA-C  methocarbamol (ROBAXIN) 500 MG tablet Take 1 tablet (500 mg total) by mouth every 8 (eight) hours as needed. 06/19/18   Petrucelli, Samantha R, PA-C  naproxen (NAPROSYN) 500 MG tablet Take 1 tablet (500 mg total) by mouth 2 (two) times daily. 06/19/18   Petrucelli, Pleas KochSamantha R, PA-C    Family History No family history on file.  Social History Social History   Tobacco Use  . Smoking status: Current Every Day Smoker    Packs/day: 0.50    Years: 15.00    Pack years: 7.50    Types: Cigarettes  . Smokeless  tobacco: Never Used  Substance Use Topics  . Alcohol use: Yes    Comment: occasional  . Drug use: Not Currently     Allergies   Patient has no known allergies.   Review of Systems Review of Systems  Constitutional: Negative for fever.  HENT: Negative for sore throat.   Eyes: Negative for redness.  Respiratory: Negative for cough and shortness of breath.   Cardiovascular: Negative for chest pain.  Gastrointestinal: Negative for abdominal pain.  Genitourinary: Negative for flank pain.  Musculoskeletal: Negative for neck pain.  Skin: Negative for rash.  Neurological: Negative for weakness and numbness.  Hematological: Does not bruise/bleed easily.  Psychiatric/Behavioral: Negative for confusion.     Physical Exam Updated Vital Signs BP (!) 150/92 (BP Location: Left Arm)   Pulse 83   Temp 98.2 F (36.8 C) (Oral)   Resp 16   Ht 1.778 m (5\' 10" )   Wt 127 kg   SpO2 98%   BMI 40.18 kg/m   Physical Exam Vitals signs and nursing note reviewed.  Constitutional:      Appearance: Normal appearance. He is well-developed.  HENT:     Head: Atraumatic.     Nose: Nose normal.     Mouth/Throat:     Mouth: Mucous membranes are moist.  Eyes:     General: No scleral icterus.  Conjunctiva/sclera: Conjunctivae normal.  Neck:     Musculoskeletal: Normal range of motion and neck supple. No neck rigidity or muscular tenderness.     Trachea: No tracheal deviation.  Cardiovascular:     Rate and Rhythm: Normal rate.     Pulses: Normal pulses.  Pulmonary:     Effort: Pulmonary effort is normal. No accessory muscle usage or respiratory distress.  Abdominal:     General: There is no distension.  Genitourinary:    Comments: No cva tenderness. Musculoskeletal:        General: No swelling.     Comments: No deformity or dislocation to right shoulder. Good passive rom right shoulder without pain. With active abduction shoulder, pt w pain. No focal bony tenderness. No arm swelling.  Radial pulse 2+.   Skin:    General: Skin is warm and dry.     Findings: No rash.  Neurological:     Mental Status: He is alert.     Comments: Alert, speech clear. RUE nvi, motor 5/5. sens grossly intact.   Psychiatric:        Mood and Affect: Mood normal.      ED Treatments / Results  Labs (all labs ordered are listed, but only abnormal results are displayed) Labs Reviewed - No data to display  EKG None  Radiology No results found.  Procedures Procedures (including critical care time)  Medications Ordered in ED Medications  acetaminophen (TYLENOL) tablet 650 mg (has no administration in time range)  ibuprofen (ADVIL) tablet 400 mg (has no administration in time range)     Initial Impression / Assessment and Plan / ED Course  I have reviewed the triage vital signs and the nursing notes.  Pertinent labs & imaging results that were available during my care of the patient were reviewed by me and considered in my medical decision making (see chart for details).  No meds pta.   Acetaminophen po. Ibuprofen po.   Reviewed nursing notes and prior charts for additional history. Multiple prior xrays of right shoulder reviewed - degen change noted.   Pt drove self - discussed plan, ibuprofen prn, will also give rx for robaxin.   Given recurrent problems with right shoulder, rec ortho f/u - discussed diff dx incl arthritis/djd, labrum injury, rotator cuff injury, tendonitis.   Pt appears stable for d/c.   Return precautions provided.   Final Clinical Impressions(s) / ED Diagnoses   Final diagnoses:  None    ED Discharge Orders    None       Lajean Saver, MD 10/24/18 [id number]

## 2018-10-24 NOTE — ED Notes (Signed)
Pt informed to pick up Rx at pharmacy listed on d/c paperwork. Work note given

## 2018-10-24 NOTE — Discharge Instructions (Addendum)
It was our pleasure to provide your ER care today - we hope that you feel better.  Take motrin as need for pain.  You may also take robaxin as need for muscle pain/spasm - no driving when taking.   Try heat to sore area.   Follow up with orthopedist in the next 2-3 weeks - call office to arrange appointment.   Return to ER if worse, new symptoms, fevers, arm numbness/weakness, other concern.

## 2018-12-11 ENCOUNTER — Encounter (HOSPITAL_BASED_OUTPATIENT_CLINIC_OR_DEPARTMENT_OTHER): Payer: Self-pay | Admitting: *Deleted

## 2018-12-11 ENCOUNTER — Other Ambulatory Visit: Payer: Self-pay

## 2018-12-11 ENCOUNTER — Emergency Department (HOSPITAL_BASED_OUTPATIENT_CLINIC_OR_DEPARTMENT_OTHER)
Admission: EM | Admit: 2018-12-11 | Discharge: 2018-12-11 | Disposition: A | Discharge status: Home / Self Care | Payer: Medicaid - Out of State | Attending: Emergency Medicine | Admitting: Emergency Medicine

## 2018-12-11 DIAGNOSIS — G8929 Other chronic pain: Secondary | ICD-10-CM | POA: Diagnosis not present

## 2018-12-11 DIAGNOSIS — M25511 Pain in right shoulder: Secondary | ICD-10-CM | POA: Diagnosis not present

## 2018-12-11 DIAGNOSIS — F17210 Nicotine dependence, cigarettes, uncomplicated: Secondary | ICD-10-CM | POA: Insufficient documentation

## 2018-12-11 MED ORDER — MELOXICAM 7.5 MG PO TABS: 7.5000 mg | ORAL_TABLET | Freq: Every day | ORAL | 0 refills | Status: DC

## 2018-12-11 MED ORDER — METHOCARBAMOL 750 MG PO TABS: 750.0000 mg | ORAL_TABLET | Freq: Three times a day (TID) | ORAL | 0 refills | Status: DC | PRN

## 2018-12-11 NOTE — ED Notes (Signed)
Pt reports taking 1 g tylenol pta, with little relief

## 2018-12-11 NOTE — ED Provider Notes (Signed)
[name] EMERGENCY DEPARTMENT Provider Note   CSN: [id number] Arrival date & time: 12/11/18  1656     History   Chief Complaint Chief Complaint  Patient presents with   Shoulder Pain    HPI Adam Ramsey is a 33 y.o. male with history of chronic right shoulder pain who presents with shoulder pain.  Patient reports this is been ongoing for a while and he had been taking some Tylenol without relief.  He reports he has been given muscle relaxers before which helps.  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  He has not been able to follow-up with orthopedics.  He denies any new changes to his pain.  He reports tightness in the upper shoulder as well as pain in the front of his shoulder.  He denies any numbness or tingling, chest pain, shortness of breath.     HPI  Past Medical History:  Diagnosis Date   Arthritis    Epilepsia [facility])    last seizure May 2016   Shoulder pain     There are no active problems to display for this patient.   History reviewed. No pertinent surgical history.      Home Medications    Prior to Admission medications   Medication Sig Start Date End Date Taking? Authorizing Provider  ibuprofen (ADVIL) 600 MG tablet Take 1 tablet (600 mg total) by mouth every 8 (eight) hours as needed. Take with food. 10/24/18   Lajean Saver, MD  lidocaine (LIDODERM) 5 % Place 1 patch onto the skin daily. Remove & Discard patch within 12 hours or as directed by MD 06/19/18   Petrucelli, Glynda Jaeger, PA-C  meloxicam (MOBIC) 7.5 MG tablet Take 1 tablet (7.5 mg total) by mouth daily. 12/11/18   Maura Braaten, Bea Graff, PA-C  methocarbamol (ROBAXIN) 750 MG tablet Take 1 tablet (750 mg total) by mouth every 8 (eight) hours as needed (muscle spasm/pain). 12/11/18   Marsella Suman, Bea Graff, PA-C  naproxen (NAPROSYN) 500 MG tablet Take 1 tablet (500 mg total) by mouth 2 (two) times daily. 06/19/18   Petrucelli, Glynda Jaeger, PA-C    Family History No family history on file.  Social  History Social History   Tobacco Use   Smoking status: Current Every Day Smoker    Packs/day: 0.50    Years: 15.00    Pack years: 7.50    Types: Cigarettes   Smokeless tobacco: Never Used  Substance Use Topics   Alcohol use: Yes    Comment: occasional   Drug use: Not Currently     Allergies   Patient has no known allergies.   Review of Systems Review of Systems  Constitutional: Negative for fever.  Musculoskeletal: Positive for arthralgias.  Neurological: Negative for numbness.     Physical Exam Updated Vital Signs BP 128/86 (BP Location: Left Arm)    Pulse 77    Temp 98.5 F (36.9 C) (Oral)    Resp 16    Ht 5\' 10"  (1.778 m)    Wt 127 kg    SpO2 100%    BMI 40.18 kg/m   Physical Exam Vitals signs and nursing note reviewed.  Constitutional:      General: He is not in acute distress.    Appearance: He is well-developed. He is not diaphoretic.  HENT:     Head: Normocephalic and atraumatic.     Mouth/Throat:     Pharynx: No oropharyngeal exudate.  Eyes:     General: No scleral icterus.  Right eye: No discharge.        Left eye: No discharge.     Conjunctiva/sclera: Conjunctivae normal.     Pupils: Pupils are equal, round, and reactive to light.  Neck:     Musculoskeletal: Normal range of motion and neck supple.     Thyroid: No thyromegaly.  Cardiovascular:     Rate and Rhythm: Normal rate and regular rhythm.     Heart sounds: Normal heart sounds. No murmur. No friction rub. No gallop.   Pulmonary:     Effort: Pulmonary effort is normal. No respiratory distress.     Breath sounds: Normal breath sounds. No stridor. No wheezing or rales.  Musculoskeletal:     Comments: Tenderness over the anterior right shoulder as well as the right upper trapezius; right upper trapezius is very tight and spasm; limited external rotation and abduction; abduction and internal rotation intact; flexion and extension intact, however painful; equal bilateral grip strength   Lymphadenopathy:     Cervical: No cervical adenopathy.  Skin:    General: Skin is warm and dry.     Coloration: Skin is not pale.     Findings: No rash.  Neurological:     Mental Status: He is alert.     Coordination: Coordination normal.      ED Treatments / Results  Labs (all labs ordered are listed, but only abnormal results are displayed) Labs Reviewed - No data to display  EKG None  Radiology No results found.  Procedures Procedures (including critical care time)  Medications Ordered in ED Medications - No data to display   Initial Impression / Assessment and Plan / ED Course  I have reviewed the triage vital signs and the nursing notes.  Pertinent labs & imaging results that were available during my care of the patient were reviewed by me and considered in my medical decision making (see chart for details).        Patient presenting with ongoing right shoulder pain.  He denies any new trauma.  No indication for imaging again today.  Will refill Robaxin and try Mobic as it does not appear patient has been given this before.  Advised not to combine with other NSAIDs.  Ice and heat alternation discussed as well as stretching.  Patient strongly encouraged to follow-up with orthopedics, as he has now been seen 11 times for the same problem in the emergency department in the last year.  Patient understands and agrees with plan.  Patient vitals stable throughout ED course and discharged in satisfactory condition.  Final Clinical Impressions(s) / ED Diagnoses   Final diagnoses:  Chronic right shoulder pain    ED Discharge Orders         Ordered    meloxicam (MOBIC) 7.5 MG tablet  Daily     12/11/18 1835    methocarbamol (ROBAXIN) 750 MG tablet  Every 8 hours PRN     12/11/18 1835           Emi HolesLaw, Hedda Crumbley M, PA-C 12/11/18 2230    Sabas SousBero, Michael M, MD 12/14/18 [phone]

## 2018-12-11 NOTE — Discharge Instructions (Addendum)
Take Mobic once daily for your pain.  Do not combine this with ibuprofen, Naprosyn, Motrin, Advil, Aleve.  You can alternate with Tylenol as needed for breakthrough pain.  Take Robaxin twice daily as needed for muscle pain or spasms.  Use ice and heat alternating 20 minutes on, 20 minutes off.  Please follow-up with the orthopedic doctor for further evaluation and treatment of your ongoing shoulder pain.  Please return to the emergency department if you develop any new or worsening symptoms.

## 2018-12-11 NOTE — ED Triage Notes (Signed)
Pt reports chronic right shoulder pain. States pain is worse today. OTC not effective. Denies new injury

## 2018-12-25 ENCOUNTER — Emergency Department (HOSPITAL_BASED_OUTPATIENT_CLINIC_OR_DEPARTMENT_OTHER)
Admission: EM | Admit: 2018-12-25 | Discharge: 2018-12-26 | Disposition: A | Discharge status: Home / Self Care | Payer: Medicaid - Out of State | Attending: Emergency Medicine | Admitting: Emergency Medicine

## 2018-12-25 ENCOUNTER — Other Ambulatory Visit: Payer: Self-pay

## 2018-12-25 ENCOUNTER — Emergency Department (HOSPITAL_BASED_OUTPATIENT_CLINIC_OR_DEPARTMENT_OTHER): Payer: Medicaid - Out of State

## 2018-12-25 ENCOUNTER — Encounter (HOSPITAL_BASED_OUTPATIENT_CLINIC_OR_DEPARTMENT_OTHER): Payer: Self-pay | Admitting: Emergency Medicine

## 2018-12-25 DIAGNOSIS — F17210 Nicotine dependence, cigarettes, uncomplicated: Secondary | ICD-10-CM | POA: Insufficient documentation

## 2018-12-25 DIAGNOSIS — M25511 Pain in right shoulder: Secondary | ICD-10-CM | POA: Diagnosis present

## 2018-12-25 DIAGNOSIS — G8929 Other chronic pain: Secondary | ICD-10-CM | POA: Diagnosis not present

## 2018-12-25 MED ORDER — NAPROXEN 250 MG PO TABS
500.0000 mg | ORAL_TABLET | Freq: Once | ORAL | Status: AC
  Administered 2018-12-25: 500 mg via ORAL
  Filled 2018-12-25: qty 2

## 2018-12-25 NOTE — ED Notes (Signed)
Patient transported to X-ray 

## 2018-12-25 NOTE — ED Triage Notes (Signed)
Lifted box at work at [employer]. Right shoulder pain. No obvious deformity.

## 2018-12-25 NOTE — ED Provider Notes (Signed)
[facility] EMERGENCY DEPARTMENT Provider Note   CSN: [id number] Arrival date & time: 12/25/18  2306     History   Chief Complaint Chief Complaint  Patient presents with  . Shoulder Injury    HPI Adam Ramsey is a 33 y.o. male.     HPI  This is a 33 year old male with history of epilepsy and shoulder pain who presents with shoulder pain.  Patient reports that he was lifting a heavy box at work when he had acute onset of worsening right shoulder pain.  Denies numbness or tingling in the arm.  States that he sustained an injury 2012 that that time has had limited range of motion of the shoulder.  He is right-handed.  Denies other injury.  Currently he rates his pain at 6 out of 10.  He has not taken anything for the pain.  Past Medical History:  Diagnosis Date  . Arthritis   . Epilepsia [facility](HCC)    last seizure May 2016  . Epilepsia (HCC)   . Shoulder pain     There are no active problems to display for this patient.   History reviewed. No pertinent surgical history.      Home Medications    Prior to Admission medications   Medication Sig Start Date End Date Taking? Authorizing Provider  ibuprofen (ADVIL) 600 MG tablet Take 1 tablet (600 mg total) by mouth every 8 (eight) hours as needed. Take with food. 10/24/18   Cathren LaineSteinl, Kevin, MD  lidocaine (LIDODERM) 5 % Place 1 patch onto the skin daily. Remove & Discard patch within 12 hours or as directed by MD 06/19/18   Petrucelli, Pleas KochSamantha R, PA-C  meloxicam (MOBIC) 7.5 MG tablet Take 1 tablet (7.5 mg total) by mouth daily. 12/11/18   Law, Waylan BogaAlexandra M, PA-C  methocarbamol (ROBAXIN) 750 MG tablet Take 1 tablet (750 mg total) by mouth every 8 (eight) hours as needed (muscle spasm/pain). 12/11/18   Law, Waylan BogaAlexandra M, PA-C  naproxen (NAPROSYN) 500 MG tablet Take 1 tablet (500 mg total) by mouth 2 (two) times daily. 12/26/18   Phelicia Dantes, Mayer Maskerourtney F, MD    Family History No family history on file.  Social History Social History   Tobacco Use  . Smoking status: Current Every Day Smoker    Packs/day: 0.50    Years: 15.00    Pack years: 7.50    Types: Cigarettes  . Smokeless tobacco: Never Used  Substance Use Topics  . Alcohol use: Yes    Comment: occasional  . Drug use: Not Currently     Allergies   Patient has no known allergies.   Review of Systems Review of Systems  Musculoskeletal:       Right shoulder pain  Skin: Negative for color change and wound.  Neurological: Negative for weakness and numbness.  All other systems reviewed and are negative.    Physical Exam Updated Vital Signs BP (!) 132/93   Pulse 82   Temp 98.2 F (36.8 C) (Oral)   Resp 20   SpO2 100%   Physical Exam Vitals signs and nursing note reviewed.  Constitutional:      Appearance: He is well-developed. He is obese. He is not ill-appearing.  HENT:     Head: Normocephalic and atraumatic.  Neck:     Musculoskeletal: Neck supple.  Cardiovascular:     Rate and Rhythm: Normal rate and regular rhythm.  Pulmonary:     Effort: Pulmonary effort is normal. No respiratory distress.  Musculoskeletal:  Comments: Focused examination of the right shoulder with no clavicular tenderness, there is no obvious deformity, limited range of motion with abduction and flexion of the shoulder, normal range of motion of the elbow, wrist, digits, 2+ radial pulse, neurovascular intact  Skin:    General: Skin is warm and dry.  Neurological:     Mental Status: He is alert and oriented to person, place, and time.  Psychiatric:        Mood and Affect: Mood normal.      ED Treatments / Results  Labs (all labs ordered are listed, but only abnormal results are displayed) Labs Reviewed - No data to display  EKG None  Radiology Dg Shoulder Right  Result Date: 12/26/2018 CLINICAL DATA:  Injury EXAM: RIGHT SHOULDER - 2+ VIEW COMPARISON:  May 08, 2018 FINDINGS: Again noted is chronic deformity of the humeral head and glenoid surface. No  definite acute fracture is seen. IMPRESSION: No acute osseous abnormality. Electronically Signed   By: Prudencio Pair M.D.   On: 12/26/2018 00:00    Procedures Procedures (including critical care time)  Medications Ordered in ED Medications  naproxen (NAPROSYN) tablet 500 mg (500 mg Oral Given 12/25/18 2346)     Initial Impression / Assessment and Plan / ED Course  I have reviewed the triage vital signs and the nursing notes.  Pertinent labs & imaging results that were available during my care of the patient were reviewed by me and considered in my medical decision making (see chart for details).        Patient presents with acute on chronic right shoulder pain.  Reports worsening pain in the right shoulder after heavy lifting.  History of the same.  He has limited range of motion on exam but states that at baseline he has some limited range of motion.  Difficult to assess whether the joint is in place.  X-ray obtained to assure no dislocation.  X-ray shows chronic humeral head and glenoid surface deformity but no acute fracture.  There is no dislocation.  Patient recommended to use NSAIDs and ice.  Avoid heavy lifting.  Follow-up with sports medicine.  After history, exam, and medical workup I feel the patient has been appropriately medically screened and is safe for discharge home. Pertinent diagnoses were discussed with the patient. Patient was given return precautions.   Final Clinical Impressions(s) / ED Diagnoses   Final diagnoses:  Chronic right shoulder pain    ED Discharge Orders         Ordered    naproxen (NAPROSYN) 500 MG tablet  2 times daily     12/26/18 0007           Missi Mcmackin, Barbette Hair, MD 12/26/18 0010

## 2018-12-26 MED ORDER — NAPROXEN 500 MG PO TABS: 500.0000 mg | ORAL_TABLET | Freq: Two times a day (BID) | ORAL | 0 refills | Status: DC

## 2018-12-26 NOTE — Discharge Instructions (Addendum)
You were seen today for shoulder pain.  Your x-rays are negative for dislocation or fracture.  Given your old injury, you likely have some arthritis.  Take naproxen as needed for pain.  Avoid heavy lifting until improved.  No up with sports medicine for ongoing issues.

## 2019-01-29 ENCOUNTER — Emergency Department (HOSPITAL_BASED_OUTPATIENT_CLINIC_OR_DEPARTMENT_OTHER)
Admission: EM | Admit: 2019-01-29 | Discharge: 2019-01-29 | Disposition: A | Discharge status: Home / Self Care | Payer: Medicaid - Out of State | Attending: Emergency Medicine | Admitting: Emergency Medicine

## 2019-01-29 ENCOUNTER — Encounter (HOSPITAL_BASED_OUTPATIENT_CLINIC_OR_DEPARTMENT_OTHER): Payer: Self-pay | Admitting: Emergency Medicine

## 2019-01-29 ENCOUNTER — Other Ambulatory Visit: Payer: Self-pay

## 2019-01-29 ENCOUNTER — Emergency Department (HOSPITAL_BASED_OUTPATIENT_CLINIC_OR_DEPARTMENT_OTHER): Payer: Medicaid - Out of State

## 2019-01-29 DIAGNOSIS — R519 Headache, unspecified: Secondary | ICD-10-CM | POA: Diagnosis present

## 2019-01-29 DIAGNOSIS — R05 Cough: Secondary | ICD-10-CM

## 2019-01-29 DIAGNOSIS — F17210 Nicotine dependence, cigarettes, uncomplicated: Secondary | ICD-10-CM | POA: Insufficient documentation

## 2019-01-29 DIAGNOSIS — B349 Viral infection, unspecified: Secondary | ICD-10-CM | POA: Diagnosis not present

## 2019-01-29 DIAGNOSIS — Z79899 Other long term (current) drug therapy: Secondary | ICD-10-CM | POA: Diagnosis not present

## 2019-01-29 DIAGNOSIS — Z20828 Contact with and (suspected) exposure to other viral communicable diseases: Secondary | ICD-10-CM | POA: Diagnosis not present

## 2019-01-29 DIAGNOSIS — J029 Acute pharyngitis, unspecified: Secondary | ICD-10-CM

## 2019-01-29 DIAGNOSIS — G40909 Epilepsy, unspecified, not intractable, without status epilepticus: Secondary | ICD-10-CM | POA: Insufficient documentation

## 2019-01-29 DIAGNOSIS — R059 Cough, unspecified: Secondary | ICD-10-CM

## 2019-01-29 LAB — GROUP A STREP BY PCR: Group A Strep by PCR: NOT DETECTED

## 2019-01-29 MED ORDER — ONDANSETRON 4 MG PO TBDP
4.0000 mg | ORAL_TABLET | Freq: Once | ORAL | Status: AC
  Administered 2019-01-29: 4 mg via ORAL
  Filled 2019-01-29: qty 1

## 2019-01-29 MED ORDER — KETOROLAC TROMETHAMINE 30 MG/ML IJ SOLN
30.0000 mg | Freq: Once | INTRAMUSCULAR | Status: AC
  Administered 2019-01-29: 11:00:00 30 mg via INTRAMUSCULAR
  Filled 2019-01-29: qty 1

## 2019-01-29 MED ORDER — ONDANSETRON 4 MG PO TBDP: 4.0000 mg | ORAL_TABLET | Freq: Three times a day (TID) | ORAL | 0 refills | Status: DC | PRN

## 2019-01-29 MED ORDER — BENZONATATE 100 MG PO CAPS: 100.0000 mg | ORAL_CAPSULE | Freq: Three times a day (TID) | ORAL | 0 refills | Status: DC

## 2019-01-29 NOTE — ED Provider Notes (Signed)
[facility] EMERGENCY DEPARTMENT Provider Note   CSN: [id number] Arrival date & time: 01/29/19  0932    History   Chief Complaint Chief Complaint  Patient presents with  . Sore Throat    HPI Adam Ramsey is a 33 y.o. male past medical history significant for arthritis who presents for evaluation of multiple complaints.  Patient states he has had nasal congestion, rhinorrhea, sore throat, nonproductive cough, body aches and pains and headache over the last 4 days.  He has been able to tolerate p.o. intake however has a decreased appetite.  Patient states he is unable to taste and smell.  His rhinorrhea is clear.  He denies any sudden onset thunderclap headache, unilateral weakness, facial droop, difficulty speaking or word finding.  He has not taken anything for his symptoms.  He rates his current symptoms a 6/10.  He denies fever, chills, emesis, chest pain, shortness of breath, drooling, dysphagia, trismus, abdominal pain, diarrhea, constipation.  Patient states he has been persistently nauseous.  Denies additional aggravating or alleviating factors.  He is unsure if he has been in contact with anyone with known Covid.  History obtained from patient and past medical records.  No interpreter is used.     HPI  Past Medical History:  Diagnosis Date  . Arthritis   . Epilepsia [facility])    last seizure May 2016  . Epilepsia (HCC)   . Shoulder pain     There are no active problems to display for this patient.   History reviewed. No pertinent surgical history.      Home Medications    Prior to Admission medications   Medication Sig Start Date End Date Taking? Authorizing Provider  benzonatate (TESSALON) 100 MG capsule Take 1 capsule (100 mg total) by mouth every 8 (eight) hours. 01/29/19   Copelyn Widmer A, PA-C  ibuprofen (ADVIL) 600 MG tablet Take 1 tablet (600 mg total) by mouth every 8 (eight) hours as needed. Take with food. 10/24/18   Cathren Laine, MD  lidocaine  (LIDODERM) 5 % Place 1 patch onto the skin daily. Remove & Discard patch within 12 hours or as directed by MD 06/19/18   Petrucelli, Pleas Koch, PA-C  meloxicam (MOBIC) 7.5 MG tablet Take 1 tablet (7.5 mg total) by mouth daily. 12/11/18   Law, Waylan Boga, PA-C  methocarbamol (ROBAXIN) 750 MG tablet Take 1 tablet (750 mg total) by mouth every 8 (eight) hours as needed (muscle spasm/pain). 12/11/18   Law, Waylan Boga, PA-C  naproxen (NAPROSYN) 500 MG tablet Take 1 tablet (500 mg total) by mouth 2 (two) times daily. 12/26/18   Horton, Mayer Masker, MD  ondansetron (ZOFRAN ODT) 4 MG disintegrating tablet Take 1 tablet (4 mg total) by mouth every 8 (eight) hours as needed for nausea or vomiting. 01/29/19   Quintell Bonnin A, PA-C    Family History No family history on file.  Social History Social History   Tobacco Use  . Smoking status: Current Every Day Smoker    Packs/day: 0.50    Years: 15.00    Pack years: 7.50    Types: Cigarettes  . Smokeless tobacco: Never Used  Substance Use Topics  . Alcohol use: Yes    Comment: occasional  . Drug use: Not Currently     Allergies   Patient has no known allergies.   Review of Systems Review of Systems  Constitutional: Positive for activity change, appetite change and fatigue. Negative for chills, diaphoresis and fever.  HENT: Positive  for congestion, postnasal drip, rhinorrhea and sore throat. Negative for drooling, sinus pressure, sinus pain, sneezing, trouble swallowing and voice change.   Respiratory: Positive for cough. Negative for shortness of breath and wheezing.   Cardiovascular: Negative.   Gastrointestinal: Positive for nausea. Negative for abdominal distention, abdominal pain, anal bleeding, blood in stool, constipation, diarrhea, rectal pain and vomiting.  Genitourinary: Negative.   Musculoskeletal: Positive for myalgias.  Skin: Negative.   Neurological: Positive for weakness and headaches. Negative for dizziness, tremors, seizures,  syncope, facial asymmetry, speech difficulty, light-headedness and numbness.  All other systems reviewed and are negative.    Physical Exam Updated Vital Signs BP 136/90 (BP Location: Left Arm)   Pulse 80   Temp 97.9 F (36.6 C) (Oral)   Resp 20   Ht 5\' 10"  (1.778 m)   Wt 127 kg   SpO2 98%   BMI 40.18 kg/m   Physical Exam Vitals signs and nursing note reviewed.  Constitutional:      General: He is not in acute distress.    Appearance: He is not ill-appearing, toxic-appearing or diaphoretic.  HENT:     Head: Normocephalic and atraumatic.     Jaw: There is normal jaw occlusion.     Right Ear: Tympanic membrane, ear canal and external ear normal. There is no impacted cerumen. No hemotympanum. Tympanic membrane is not injected, scarred, perforated, erythematous, retracted or bulging.     Left Ear: Tympanic membrane, ear canal and external ear normal. There is no impacted cerumen. No hemotympanum. Tympanic membrane is not injected, scarred, perforated, erythematous, retracted or bulging.     Ears:     Comments: No Mastoid tenderness.    Nose:     Comments: Clear rhinorrhea and congestion to bilateral nares.  No sinus tenderness.    Mouth/Throat:     Comments: Posterior oropharynx clear.  Mucous membranes moist.  Tonsils without erythema or exudate.  Uvula midline without deviation.  No evidence of PTA or RPA.  No drooling, dysphasia or trismus.  Phonation normal. Neck:     Trachea: Trachea and phonation normal.     Meningeal: Brudzinski's sign and Kernig's sign absent.     Comments: No Neck stiffness or neck rigidity.  No meningismus.  No cervical lymphadenopathy. Cardiovascular:     Comments: No murmurs rubs or gallops. Pulmonary:     Comments: Clear to auscultation bilaterally without wheeze, rhonchi or rales.  No accessory muscle usage.  Able speak in full sentences. Abdominal:     Comments: Soft, nontender without rebound or guarding.  No CVA tenderness.  Musculoskeletal:      Comments: Moves all 4 extremities without difficulty.  Lower extremities without edema, erythema or warmth.  Skin:    Comments: Brisk capillary refill.  No rashes or lesions.  Neurological:     Mental Status: He is alert.     Comments: Ambulatory in department without difficulty.  Cranial nerves II through XII grossly intact.  No facial droop.  No dysphasia.  Negative finger-to-nose, Romberg, heel-to-shin.     ED Treatments / Results  Labs (all labs ordered are listed, but only abnormal results are displayed) Labs Reviewed  GROUP A STREP BY PCR  NOVEL CORONAVIRUS, NAA (HOSP ORDER, SEND-OUT TO REF LAB; TAT 18-24 HRS)    EKG None  Radiology Dg Chest Portable 1 View  Result Date: 01/29/2019 CLINICAL DATA:  Cough EXAM: PORTABLE CHEST 1 VIEW COMPARISON:  September 26, 2017 FINDINGS: No edema or consolidation. Heart is upper normal in  size with pulmonary vascularity normal. No adenopathy. No bone lesions. IMPRESSION: No edema or consolidation.  Heart upper normal in size. Electronically Signed   By: Lowella Grip III M.D.   On: 01/29/2019 11:02    Procedures Procedures (including critical care time)  Medications Ordered in ED Medications  ondansetron (ZOFRAN-ODT) disintegrating tablet 4 mg (4 mg Oral Given 01/29/19 1038)  ketorolac (TORADOL) 30 MG/ML injection 30 mg (30 mg Intramuscular Given 01/29/19 1040)    Initial Impression / Assessment and Plan / ED Course  I have reviewed the triage vital signs and the nursing notes.  Pertinent labs & imaging results that were available during my care of the patient were reviewed by me and considered in my medical decision making (see chart for details).  33 year old male presents for evaluation of multiple complaints.  He is afebrile, nonseptic, non-ill-appearing.  Tolerating p.o. intake at home.  Heart and lungs clear.  Posterior oropharynx mildly erythematous however no exudate.  Tonsils without erythema or exudate.  Uvula midline  without deviation.  No drooling, dysphagia or trismus.  No neck stiffness or neck rigidity.  No meningismus.  Abdomen soft, nontender without rebound or guarding.  Full range of motion to all 4 extremities without any swelling or tenderness to joints.  Nonfocal neurologic exam without deficits.  No sudden onset thunderclap headache or recent injury or trauma.  No vision changes.  Strep negative COVID outpatient pending DG chest without acute infiltrates, cardiomegaly, pulmonary edema, pneumothorax  Patient likely with a viral illness.  He appears overall well.  Tolerating p.o. intake.  Will DC home with symptomatic management.  Patient to remain out of work or school until his Covid test has resulted.  Discussed strict return precautions.  Patient voiced understanding and is agreeable for follow-up.  To return for any new or worsening symptoms       Byron Nicklaus was evaluated in Emergency Department on 01/29/2019 for the symptoms described in the history of present illness. He was evaluated in the context of the global COVID-19 pandemic, which necessitated consideration that the patient might be at risk for infection with the SARS-CoV-2 virus that causes COVID-19. Institutional protocols and algorithms that pertain to the evaluation of patients at risk for COVID-19 are in a state of rapid change based on information released by regulatory bodies including the CDC and federal and state organizations. These policies and algorithms were followed during the patient's care in the ED. Final Clinical Impressions(s) / ED Diagnoses   Final diagnoses:  Acute nonintractable headache, unspecified headache type  Cough  Sore throat  Viral illness    ED Discharge Orders         Ordered    benzonatate (TESSALON) 100 MG capsule  Every 8 hours     01/29/19 1126    ondansetron (ZOFRAN ODT) 4 MG disintegrating tablet  Every 8 hours PRN     01/29/19 1126           Chirag Krueger A, PA-C 01/29/19 [place], [place], MD 01/30/19 [phone]

## 2019-01-29 NOTE — Discharge Instructions (Addendum)
Your strep throat test was negative. Take medications as prescribed.  May also take Tylenol and ibuprofen.  Do not take more than 4000 mg Tylenol or more than 2400 mg ibuprofen daily.  If you develop any new worsening symptoms such as coughing up blood, chest pain, shortness of breath, inability to tolerate liquids.

## 2019-01-29 NOTE — ED Triage Notes (Signed)
Headache, sore throat, nausea since Saturday.

## 2019-01-30 LAB — NOVEL CORONAVIRUS, NAA (HOSP ORDER, SEND-OUT TO REF LAB; TAT 18-24 HRS): SARS-CoV-2, NAA: NOT DETECTED

## 2019-02-12 ENCOUNTER — Emergency Department (HOSPITAL_BASED_OUTPATIENT_CLINIC_OR_DEPARTMENT_OTHER): Payer: Medicaid - Out of State

## 2019-02-12 ENCOUNTER — Encounter (HOSPITAL_BASED_OUTPATIENT_CLINIC_OR_DEPARTMENT_OTHER): Payer: Self-pay

## 2019-02-12 ENCOUNTER — Emergency Department (HOSPITAL_BASED_OUTPATIENT_CLINIC_OR_DEPARTMENT_OTHER)
Admission: EM | Admit: 2019-02-12 | Discharge: 2019-02-13 | Disposition: A | Discharge status: Home / Self Care | Payer: Medicaid - Out of State | Attending: Emergency Medicine | Admitting: Emergency Medicine

## 2019-02-12 ENCOUNTER — Other Ambulatory Visit: Payer: Self-pay

## 2019-02-12 DIAGNOSIS — Z79899 Other long term (current) drug therapy: Secondary | ICD-10-CM | POA: Diagnosis not present

## 2019-02-12 DIAGNOSIS — X509XXA Other and unspecified overexertion or strenuous movements or postures, initial encounter: Secondary | ICD-10-CM | POA: Diagnosis not present

## 2019-02-12 DIAGNOSIS — R200 Anesthesia of skin: Secondary | ICD-10-CM | POA: Diagnosis not present

## 2019-02-12 DIAGNOSIS — Y990 Civilian activity done for income or pay: Secondary | ICD-10-CM | POA: Insufficient documentation

## 2019-02-12 DIAGNOSIS — F17210 Nicotine dependence, cigarettes, uncomplicated: Secondary | ICD-10-CM | POA: Diagnosis not present

## 2019-02-12 DIAGNOSIS — Y9389 Activity, other specified: Secondary | ICD-10-CM | POA: Insufficient documentation

## 2019-02-12 DIAGNOSIS — S4991XA Unspecified injury of right shoulder and upper arm, initial encounter: Secondary | ICD-10-CM | POA: Diagnosis not present

## 2019-02-12 DIAGNOSIS — Y9289 Other specified places as the place of occurrence of the external cause: Secondary | ICD-10-CM | POA: Insufficient documentation

## 2019-02-12 NOTE — ED Triage Notes (Signed)
Pt states he was at work trying to lift something with a co-worker when he felt his R shoulder "go out and back in" followed by pain. Pt states this happens regularly and he normally gets better in a couple of days, but his employer insisted he be evaluated.

## 2019-02-13 MED ORDER — NAPROXEN 500 MG PO TABS: 500.0000 mg | ORAL_TABLET | Freq: Two times a day (BID) | ORAL | 0 refills | Status: DC

## 2019-02-13 NOTE — Discharge Instructions (Signed)
You were seen today for right shoulder injury.  Given your history, you are at high risk for rotator cuff injury and your injury is likely chronic.  Follow-up with sports medicine.  You may benefit from MRI imaging for further evaluation if your shoulder continues to bother you.  Take naproxen as needed for pain.

## 2019-02-13 NOTE — ED Provider Notes (Signed)
[facility] EMERGENCY DEPARTMENT Provider Note   CSN: [id number] Arrival date & time: 02/12/19  2256     History   Chief Complaint Chief Complaint  Patient presents with  . Shoulder Injury    HPI Adam Ramsey is a 33 y.o. male.     HPI  This is a 33 year old male with history of epilepsy and obesity who presents with right shoulder pain.  Patient reports that he was lifting something at work when his right shoulder felt like it "went out and in."  There is a history of the same.  Reports an old injury and he frequently will have a similar experience when lifting heavy things.  He states he has some pain and tingling for couple days and it usually resolves.  However because he was at work, they made him come get evaluated.  Currently he states he does not have any pain.  He did have some tingling in the right deltoid region but that has resolved as well.  Denies other injury.  Past Medical History:  Diagnosis Date  . Arthritis   . Epilepsia [facility])    last seizure May 2016  . Epilepsia (HCC)   . Shoulder pain     There are no active problems to display for this patient.   History reviewed. No pertinent surgical history.      Home Medications    Prior to Admission medications   Medication Sig Start Date End Date Taking? Authorizing Provider  benzonatate (TESSALON) 100 MG capsule Take 1 capsule (100 mg total) by mouth every 8 (eight) hours. 01/29/19   Henderly, Britni A, PA-C  ibuprofen (ADVIL) 600 MG tablet Take 1 tablet (600 mg total) by mouth every 8 (eight) hours as needed. Take with food. 10/24/18   Cathren Laine, MD  lidocaine (LIDODERM) 5 % Place 1 patch onto the skin daily. Remove & Discard patch within 12 hours or as directed by MD 06/19/18   Petrucelli, Pleas Koch, PA-C  meloxicam (MOBIC) 7.5 MG tablet Take 1 tablet (7.5 mg total) by mouth daily. 12/11/18   Law, Waylan Boga, PA-C  methocarbamol (ROBAXIN) 750 MG tablet Take 1 tablet (750 mg total) by mouth  every 8 (eight) hours as needed (muscle spasm/pain). 12/11/18   Law, Waylan Boga, PA-C  naproxen (NAPROSYN) 500 MG tablet Take 1 tablet (500 mg total) by mouth 2 (two) times daily. 02/13/19   Abbey Veith, Mayer Masker, MD  ondansetron (ZOFRAN ODT) 4 MG disintegrating tablet Take 1 tablet (4 mg total) by mouth every 8 (eight) hours as needed for nausea or vomiting. 01/29/19   Henderly, Britni A, PA-C    Family History No family history on file.  Social History Social History   Tobacco Use  . Smoking status: Current Every Day Smoker    Packs/day: 0.50    Years: 15.00    Pack years: 7.50    Types: Cigarettes  . Smokeless tobacco: Never Used  Substance Use Topics  . Alcohol use: Not Currently    Comment: occasional  . Drug use: Not Currently     Allergies   Patient has no known allergies.   Review of Systems Review of Systems  Constitutional: Negative for fever.  Musculoskeletal:       Right shoulder pain  Neurological: Positive for numbness. Negative for weakness.  All other systems reviewed and are negative.    Physical Exam Updated Vital Signs BP (!) 144/100 (BP Location: Left Arm)   Pulse 81   Temp 99.2  F (37.3 C) (Oral)   Resp 16   Ht 1.778 m (5\' 10" )   Wt 131.5 kg   SpO2 97%   BMI 41.61 kg/m   Physical Exam Vitals signs and nursing note reviewed.  Constitutional:      Appearance: He is well-developed. He is obese.  HENT:     Head: Normocephalic and atraumatic.  Eyes:     Pupils: Pupils are equal, round, and reactive to light.  Neck:     Musculoskeletal: Neck supple.  Cardiovascular:     Rate and Rhythm: Normal rate and regular rhythm.     Heart sounds: Normal heart sounds.  Pulmonary:     Effort: Pulmonary effort is normal. No respiratory distress.     Breath sounds: Normal breath sounds.  Musculoskeletal:     Comments: No obvious deformity or tenderness to palpation of the right shoulder, there is limited range of motion with abduction and flexion, 2+  radial pulse, neurovascular intact distally  Skin:    General: Skin is warm and dry.  Neurological:     Mental Status: He is alert and oriented to person, place, and time.  Psychiatric:        Mood and Affect: Mood normal.      ED Treatments / Results  Labs (all labs ordered are listed, but only abnormal results are displayed) Labs Reviewed - No data to display  EKG None  Radiology Dg Shoulder Right  Result Date: 02/12/2019 CLINICAL DATA:  Right shoulder injury at work EXAM: RIGHT SHOULDER - 2+ VIEW COMPARISON:  12/25/2018 FINDINGS: Chronic deformity of the humeral head and glenoid. No definite acute traumatic abnormality is seen. Humeral head remains normally seated. Mild acromioclavicular arthrosis is present. No suspicious osseous lesions. Soft tissues are unremarkable. Included portion of the right chest wall is free of acute abnormality. IMPRESSION: Chronic deformity of the humeral head and glenoid, consistent with sequela of prior trauma. No definite acute traumatic abnormality. Electronically Signed   By: Kreg ShropshirePrice  DeHay M.D.   On: 02/12/2019 23:39    Procedures Procedures (including critical care time)  Medications Ordered in ED Medications - No data to display   Initial Impression / Assessment and Plan / ED Course  I have reviewed the triage vital signs and the nursing notes.  Pertinent labs & imaging results that were available during my care of the patient were reviewed by me and considered in my medical decision making (see chart for details).        Patient presents with injury to the right shoulder.  History is suggestive of recurrent dislocation.  He is neurovascularly intact at this time.  He is without pain.  X-ray showed chronic deformity of the humeral head and glenoid.  Patient reports that he is aware of this finding.  Recommend sports medicine follow-up.  He may benefit from MRI imaging to evaluate his rotator cuff.  I suspect that he may have recurrent  dislocation and would potentially be a candidate for surgery.  Naproxen for pain.  After history, exam, and medical workup I feel the patient has been appropriately medically screened and is safe for discharge home. Pertinent diagnoses were discussed with the patient. Patient was given return precautions.   Final Clinical Impressions(s) / ED Diagnoses   Final diagnoses:  Injury of right shoulder, initial encounter    ED Discharge Orders         Ordered    naproxen (NAPROSYN) 500 MG tablet  2 times daily  02/13/19 0033           Merryl Hacker, MD 02/13/19 [phone]

## 2019-03-01 ENCOUNTER — Encounter (HOSPITAL_BASED_OUTPATIENT_CLINIC_OR_DEPARTMENT_OTHER): Payer: Self-pay

## 2019-03-01 ENCOUNTER — Emergency Department (HOSPITAL_BASED_OUTPATIENT_CLINIC_OR_DEPARTMENT_OTHER)
Admission: EM | Admit: 2019-03-01 | Discharge: 2019-03-02 | Disposition: A | Discharge status: Home / Self Care | Payer: Medicaid - Out of State | Attending: Emergency Medicine | Admitting: Emergency Medicine

## 2019-03-01 ENCOUNTER — Other Ambulatory Visit: Payer: Self-pay

## 2019-03-01 DIAGNOSIS — F17210 Nicotine dependence, cigarettes, uncomplicated: Secondary | ICD-10-CM | POA: Diagnosis not present

## 2019-03-01 DIAGNOSIS — M25511 Pain in right shoulder: Secondary | ICD-10-CM | POA: Diagnosis not present

## 2019-03-01 DIAGNOSIS — Z79899 Other long term (current) drug therapy: Secondary | ICD-10-CM | POA: Insufficient documentation

## 2019-03-01 DIAGNOSIS — G8929 Other chronic pain: Secondary | ICD-10-CM

## 2019-03-01 NOTE — ED Triage Notes (Addendum)
Pt c/o right shoulder pain x 7-8 years-denies recent injury-states his employer made him come to ED-NAD-steady gait

## 2019-03-02 MED ORDER — NAPROXEN 500 MG PO TABS: 500.0000 mg | ORAL_TABLET | Freq: Two times a day (BID) | ORAL | 0 refills | Status: AC

## 2019-03-02 NOTE — ED Provider Notes (Signed)
[place] [facility] Provider Note: Georgena Spurling, MD, FACEP  CSN: [id number] MRN: [id number] ARRIVAL: 03/01/19 at 2257 ROOM: [place]  Shoulder Pain   HISTORY OF PRESENT ILLNESS  03/02/19 1:34 AM Adam Ramsey is a 33 y.o. male with chronic pain in his right shoulder for 7 to 8 years.  He started experiencing pain at work yesterday.  The pain is not severe and he rates it as a 2 out of 10, worse with movement.  He does not have full range of motion of the right shoulder but this has not acutely changed.  His x-rays have repeatedly showed chronic changes of the right shoulder joint.  He states his boss made him come in to get evaluated before he would let him return to work.  The patient denies any acute changes.  He was prescribed naproxen previously but has run out.  He has been taking over-the-counter Tylenol for pain.   Past Medical History:  Diagnosis Date  . Arthritis   . Epilepsia [facility])    last seizure May 2016  . Epilepsia ([place])   . Shoulder pain     History reviewed. No pertinent surgical history.  No family history on file.  Social History   Tobacco Use  . Smoking status: Current Every Day Smoker    Packs/day: 0.50    Years: 15.00    Pack years: 7.50    Types: Cigarettes  . Smokeless tobacco: Never Used  Substance Use Topics  . Alcohol use: Yes    Comment: occasional  . Drug use: Not Currently    Prior to Admission medications   Medication Sig Start Date End Date Taking? Authorizing Provider  benzonatate (TESSALON) 100 MG capsule Take 1 capsule (100 mg total) by mouth every 8 (eight) hours. 01/29/19   Henderly, Britni A, PA-C  ibuprofen (ADVIL) 600 MG tablet Take 1 tablet (600 mg total) by mouth every 8 (eight) hours as needed. Take with food. 10/24/18   Lajean Saver, MD  lidocaine (LIDODERM) 5 % Place 1 patch onto the skin daily. Remove & Discard patch within 12 hours or as directed by MD 06/19/18   Petrucelli, Glynda Jaeger, PA-C   meloxicam (MOBIC) 7.5 MG tablet Take 1 tablet (7.5 mg total) by mouth daily. 12/11/18   Law, Bea Graff, PA-C  methocarbamol (ROBAXIN) 750 MG tablet Take 1 tablet (750 mg total) by mouth every 8 (eight) hours as needed (muscle spasm/pain). 12/11/18   Law, Bea Graff, PA-C  naproxen (NAPROSYN) 500 MG tablet Take 1 tablet (500 mg total) by mouth 2 (two) times daily. 02/13/19   Horton, Barbette Hair, MD  ondansetron (ZOFRAN ODT) 4 MG disintegrating tablet Take 1 tablet (4 mg total) by mouth every 8 (eight) hours as needed for nausea or vomiting. 01/29/19   Henderly, Britni A, PA-C    Allergies Patient has no known allergies.   REVIEW OF SYSTEMS  Negative except as noted here or in the History of Present Illness.   PHYSICAL EXAMINATION  Initial Vital Signs Blood pressure 127/81, pulse 88, temperature 98.3 F (36.8 C), temperature source Oral, resp. rate 18, height 5\' 9"  (1.753 m), weight 129.3 kg, SpO2 98 %.  Examination General: Well-developed, well-nourished male in no acute distress; appearance consistent with age of record HENT: normocephalic; atraumatic Eyes: Normal appearance Neck: supple Heart: regular rate and rhythm Lungs: clear to auscultation bilaterally Abdomen: soft; nondistended; nontender; bowel sounds present Extremities: No deformity; pain on passive range of motion of right  shoulder, range of motion somewhat limited Neurologic: Awake, alert and oriented; motor function intact in all extremities and symmetric; no facial droop Skin: Warm and dry Psychiatric: Normal mood and affect   RESULTS  Summary of this visit's results, reviewed and interpreted by myself:   EKG Interpretation  Date/Time:    Ventricular Rate:    PR Interval:    QRS Duration:   QT Interval:    QTC Calculation:   R Axis:     Text Interpretation:        Laboratory Studies: No results found for this or any previous visit (from the past 24 hour(s)). Imaging Studies: No results found.  ED  COURSE and MDM  Nursing notes, initial and subsequent vitals signs, including pulse oximetry, reviewed and interpreted by myself.  Vitals:   03/01/19 2305 03/02/19 0101  BP: (!) 145/90 127/81  Pulse: (!) 104 88  Resp: 18 18  Temp: 98.3 F (36.8 C)   TempSrc: Oral   SpO2: 99% 98%  Weight: 129.3 kg   Height: 5\' 9"  (1.753 m)    Medications - No data to display  No evidence of dislocation of right shoulder on exam.  Patient has a reasonable range of motion and ability to use his right shoulder.  He does not wish to have his work limited.  We will refill his naproxen.  He has been referred to orthopedics in the past but has avoided surgery.  PROCEDURES  Procedures   ED DIAGNOSES     ICD-10-CM   1. Chronic right shoulder pain  M25.511    G89.29        Adam Ramsey, , MD 03/02/19 [phone]

## 2019-08-22 ENCOUNTER — Emergency Department (HOSPITAL_BASED_OUTPATIENT_CLINIC_OR_DEPARTMENT_OTHER): Payer: Medicaid - Out of State

## 2019-08-22 ENCOUNTER — Encounter (HOSPITAL_BASED_OUTPATIENT_CLINIC_OR_DEPARTMENT_OTHER): Payer: Self-pay | Admitting: *Deleted

## 2019-08-22 ENCOUNTER — Other Ambulatory Visit: Payer: Self-pay

## 2019-08-22 ENCOUNTER — Emergency Department (HOSPITAL_BASED_OUTPATIENT_CLINIC_OR_DEPARTMENT_OTHER)
Admission: EM | Admit: 2019-08-22 | Discharge: 2019-08-22 | Disposition: A | Discharge status: Home / Self Care | Payer: Medicaid - Out of State | Attending: Emergency Medicine | Admitting: Emergency Medicine

## 2019-08-22 DIAGNOSIS — G8929 Other chronic pain: Secondary | ICD-10-CM | POA: Diagnosis not present

## 2019-08-22 DIAGNOSIS — F17210 Nicotine dependence, cigarettes, uncomplicated: Secondary | ICD-10-CM | POA: Insufficient documentation

## 2019-08-22 DIAGNOSIS — G40909 Epilepsy, unspecified, not intractable, without status epilepticus: Secondary | ICD-10-CM | POA: Diagnosis not present

## 2019-08-22 DIAGNOSIS — M25511 Pain in right shoulder: Secondary | ICD-10-CM | POA: Insufficient documentation

## 2019-08-22 MED ORDER — DICLOFENAC SODIUM 1 % EX GEL: 4.0000 g | Freq: Four times a day (QID) | CUTANEOUS | 0 refills | Status: AC

## 2019-08-22 MED ORDER — NAPROXEN 250 MG PO TABS
500.0000 mg | ORAL_TABLET | Freq: Once | ORAL | Status: AC
  Administered 2019-08-22: 500 mg via ORAL
  Filled 2019-08-22: qty 2

## 2019-08-22 NOTE — ED Provider Notes (Signed)
[facility] EMERGENCY DEPARTMENT Provider Note   CSN: [id number] Arrival date & time: 08/22/19  2235     History Chief Complaint  Patient presents with  . Shoulder Pain    Adam Ramsey is a 34 y.o. male.  The history is provided by the patient.  Shoulder Pain Location:  Shoulder Injury: yes (8 years ago )   Mechanism of injury comment:  Seizure 8 years ago Pain details:    Quality:  Aching   Radiates to:  Does not radiate   Severity:  Moderate   Onset quality:  Gradual   Duration:  96 months   Timing:  Constant   Progression:  Unchanged Dislocation: no   Foreign body present:  No foreign bodies Relieved by:  Nothing Worsened by:  Nothing Ineffective treatments:  None tried Associated symptoms: no back pain, no decreased range of motion, no fatigue, no fever, no muscle weakness, no neck pain, no numbness, no swelling and no tingling   Risk factors: no concern for non-accidental trauma   Chronic right shoulder pain from a seizure 8 years ago.       Past Medical History:  Diagnosis Date  . Arthritis   . Epilepsia [facility])    last seizure May 2016  . Epilepsia ([facility])   . Shoulder pain     There are no problems to display for this patient.   History reviewed. No pertinent surgical history.     History reviewed. No pertinent family history.  Social History   Tobacco Use  . Smoking status: Current Every Day Smoker    Packs/day: 0.50    Years: 15.00    Pack years: 7.50    Types: Cigarettes  . Smokeless tobacco: Never Used  Substance Use Topics  . Alcohol use: Yes    Comment: occasional  . Drug use: Not Currently    Home Medications Prior to Admission medications   Medication Sig Start Date End Date Taking? Authorizing Provider  diclofenac Sodium (VOLTAREN) 1 % GEL Apply 4 g topically 4 (four) times daily. 08/22/19   Shaunita Seney, MD  naproxen (NAPROSYN) 500 MG tablet Take 1 tablet (500 mg total) by mouth 2 (two) times daily. 03/02/19    Molpus, Jonny Ruiz, MD    Allergies    Patient has no known allergies.  Review of Systems   Review of Systems  Constitutional: Negative for fatigue and fever.  HENT: Negative for congestion.   Eyes: Negative for visual disturbance.  Respiratory: Negative for shortness of breath.   Cardiovascular: Negative for chest pain.  Gastrointestinal: Negative for abdominal pain.  Genitourinary: Negative for difficulty urinating.  Musculoskeletal: Positive for arthralgias. Negative for back pain and neck pain.  Neurological: Negative for dizziness, seizures, weakness and numbness.  All other systems reviewed and are negative.   Physical Exam Updated Vital Signs BP 136/79   Pulse 64   Temp 98.5 F (36.9 C) (Oral)   Resp 20   Ht 5\' 9"  (1.753 m)   Wt 131.5 kg   SpO2 100%   BMI 42.83 kg/m   Physical Exam Vitals and nursing note reviewed.  Constitutional:      General: He is not in acute distress.    Appearance: Normal appearance.  HENT:     Head: Normocephalic and atraumatic.     Nose: Nose normal.  Eyes:     Pupils: Pupils are equal, round, and reactive to light.  Cardiovascular:     Rate and Rhythm: Normal rate and regular rhythm.  Pulses: Normal pulses.     Heart sounds: Normal heart sounds.  Pulmonary:     Effort: Pulmonary effort is normal.     Breath sounds: Normal breath sounds.  Abdominal:     General: Abdomen is flat. Bowel sounds are normal.     Tenderness: There is no abdominal tenderness. There is no guarding.  Musculoskeletal:        General: Normal range of motion.     Right shoulder: Normal.     Right upper arm: Normal.     Right elbow: Normal.     Right forearm: Normal.     Right wrist: Normal. No snuff box tenderness or crepitus. Normal pulse.     Right hand: Normal.     Cervical back: Normal range of motion and neck supple.  Skin:    General: Skin is warm and dry.     Capillary Refill: Capillary refill takes less than 2 seconds.  Neurological:      General: No focal deficit present.     Mental Status: He is alert and oriented to person, place, and time.     Deep Tendon Reflexes: Reflexes normal.  Psychiatric:        Mood and Affect: Mood normal.        Behavior: Behavior normal.     ED Results / Procedures / Treatments   Labs (all labs ordered are listed, but only abnormal results are displayed) Labs Reviewed - No data to display  EKG None  Radiology DG Shoulder Right  Result Date: 08/22/2019 CLINICAL DATA:  Atraumatic right shoulder pain. EXAM: RIGHT SHOULDER - 2+ VIEW COMPARISON:  February 12, 2019 FINDINGS: There is no evidence of acute fracture or dislocation. A chronic appearing deformity is again seen involving the right humeral head. Moderate severity degenerative changes are seen involving the glenohumeral articulation and right glenoid. Soft tissues are unremarkable. IMPRESSION: 1. Chronic appearing deformity of the right humeral head. 2. Moderate severity degenerative changes. Electronically Signed   By: Virgina Norfolk M.D.   On: 08/22/2019 22:55    Procedures Procedures (including critical care time)  Medications Ordered in ED Medications  naproxen (NAPROSYN) tablet 500 mg (has no administration in time range)    ED Course  I have reviewed the triage vital signs and the nursing notes.  Pertinent labs & imaging results that were available during my care of the patient were reviewed by me and considered in my medical decision making (see chart for details).   Pain is chronic.  No acute finding.  I will refer to Orthopedics for ongoing care.   Adam Ramsey was evaluated in Emergency Department on 08/22/2019 for the symptoms described in the history of present illness. He was evaluated in the context of the global COVID-19 pandemic, which necessitated consideration that the patient might be at risk for infection with the SARS-CoV-2 virus that causes COVID-19. Institutional protocols and algorithms that pertain to the  evaluation of patients at risk for COVID-19 are in a state of rapid change based on information released by regulatory bodies including the CDC and federal and state organizations. These policies and algorithms were followed during the patient's care in the ED.  Final Clinical Impression(s) / ED Diagnoses Final diagnoses:  Chronic right shoulder pain  Return for intractable cough, coughing up blood,fevers >100.4 unrelieved by medication, shortness of breath, intractable vomiting, chest pain, shortness of breath, weakness,numbness, changes in speech, facial asymmetry,abdominal pain, passing out,Inability to tolerate liquids or food, cough, altered mental status  or any concerns. No signs of systemic illness or infection. The patient is nontoxic-appearing on exam and vital signs are within normal limits.   I have reviewed the triage vital signs and the nursing notes. Pertinent labs &imaging results that were available during my care of the patient were reviewed by me and considered in my medical decision making (see chart for details).After history, exam, and medical workup I feel the patient has beenappropriately medically screened and is safe for discharge home. Pertinent diagnoses were discussed with the patient. Patient was given return precautions.   Rx / DC Orders ED Discharge Orders         Ordered    diclofenac Sodium (VOLTAREN) 1 % GEL  4 times daily     08/22/19 2313           Everlene Cunning, MD 08/22/19 2321

## 2019-08-22 NOTE — ED Triage Notes (Signed)
Right shoulder pain. States he has had pain on and off for about 8 years.

## 2019-12-06 IMAGING — CR DG SHOULDER 2+V*R*
3 series · 3 of 3 positions shown · non-contrast
Comparison: 12/25/2018

CLINICAL DATA: Right shoulder injury at work

EXAM:
RIGHT SHOULDER - 2+ VIEW

[w shoulder grashey right *]
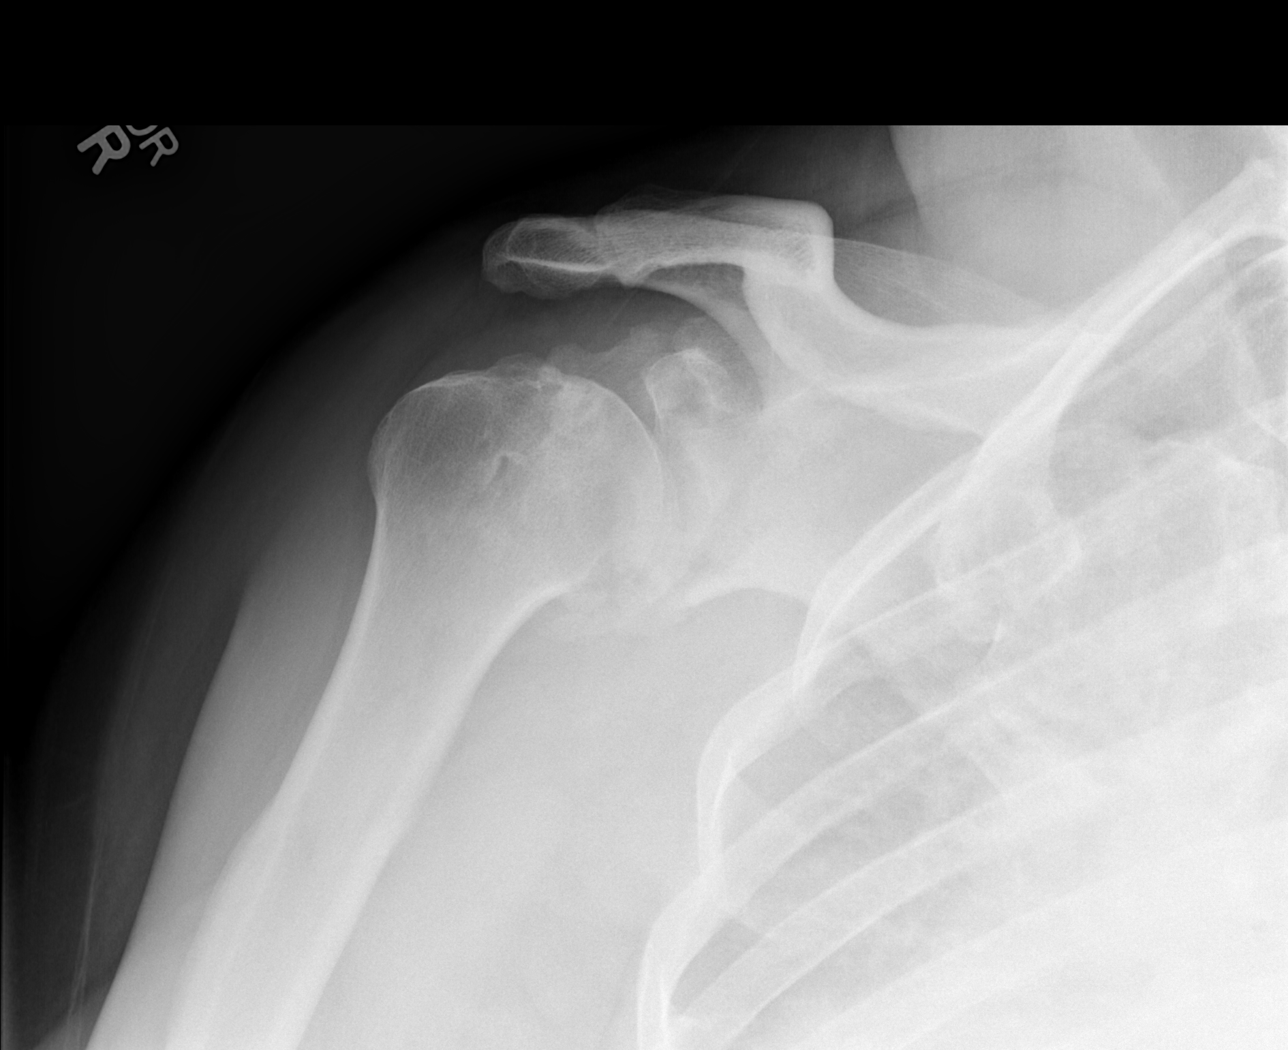

[w shoulder y view right *]
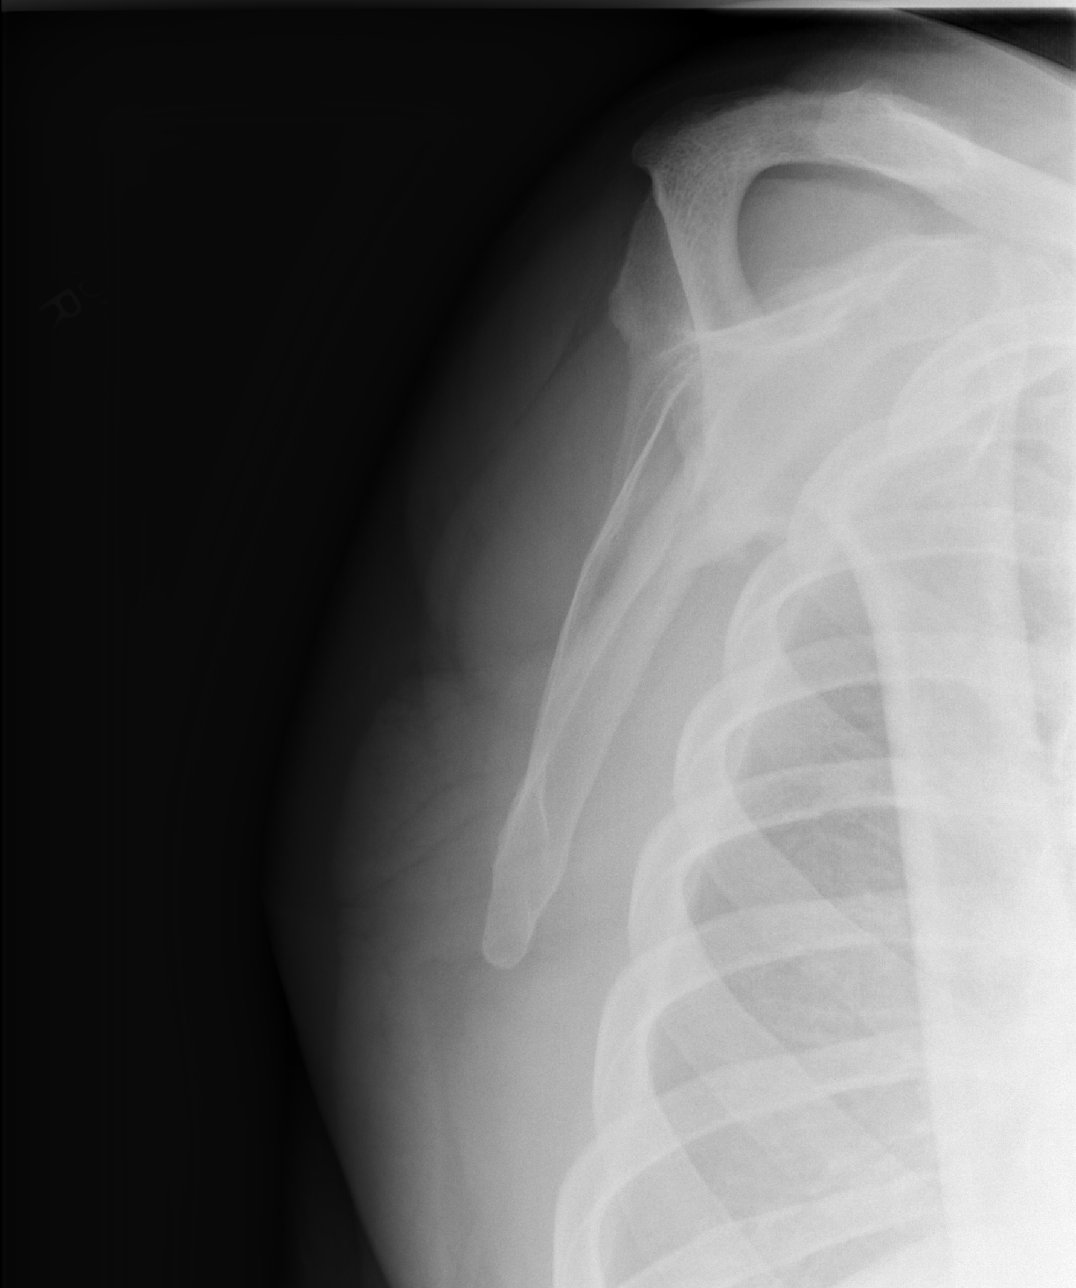

[x shoulder axillary right *]
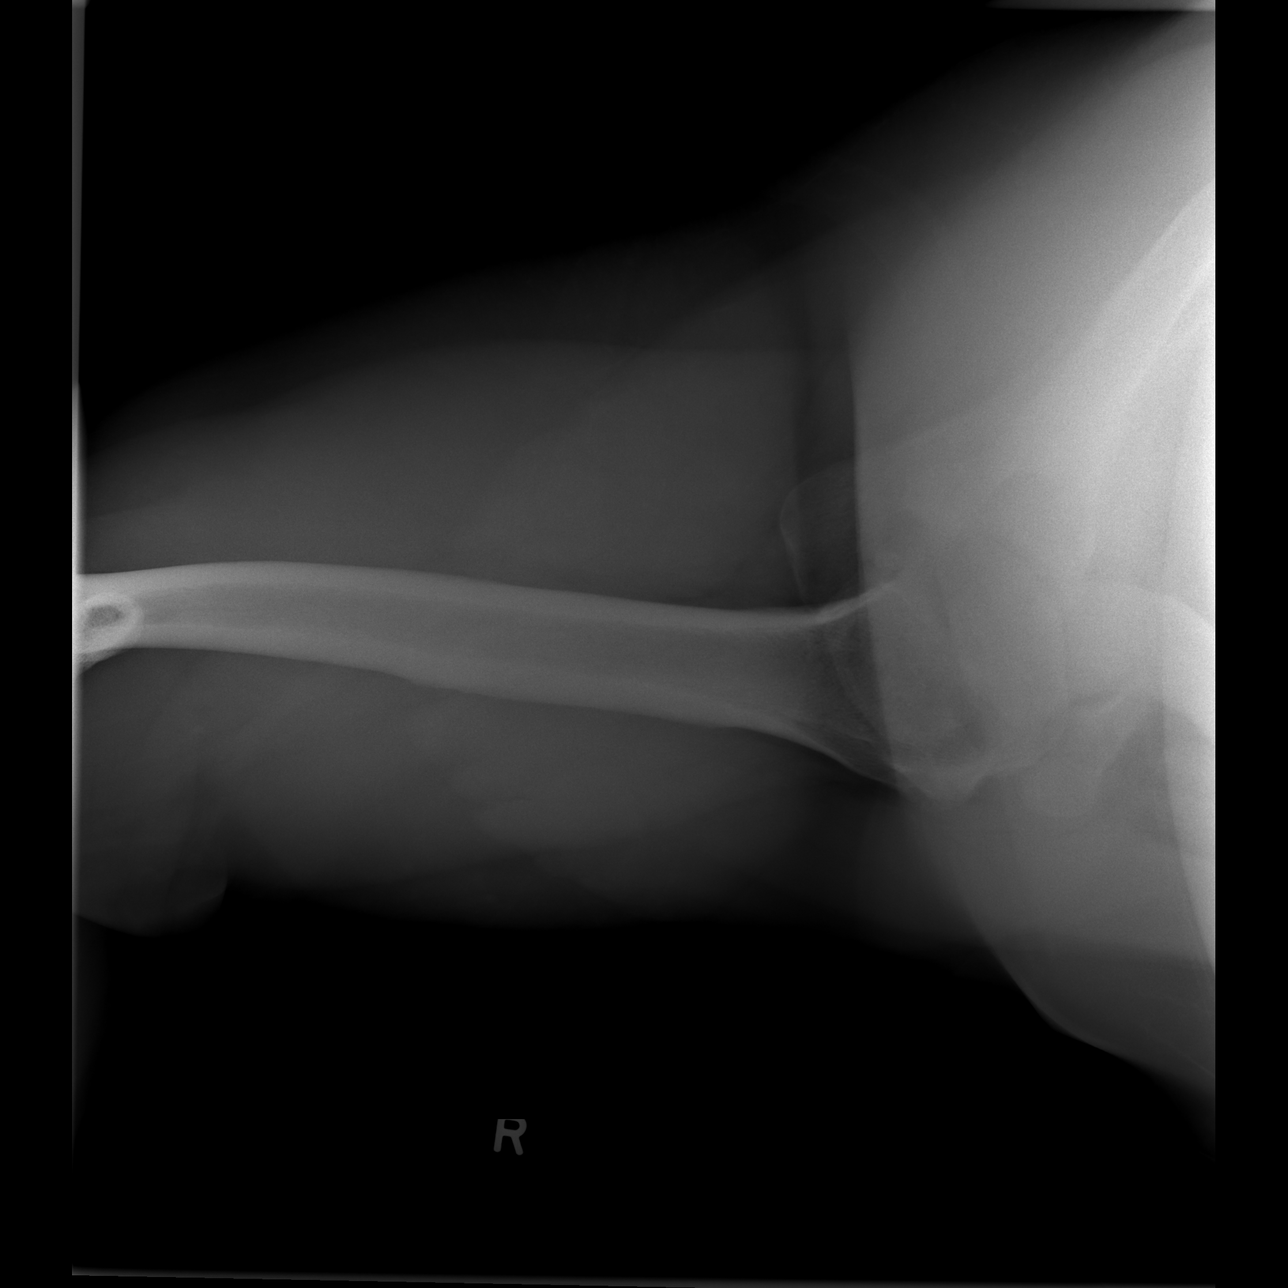

[3 of 3 positions shown; findings below may reference images not displayed]

FINDINGS: Chronic deformity of the humeral head and glenoid. No definite acute
traumatic abnormality is seen. Humeral head remains normally seated.
Mild acromioclavicular arthrosis is present. No suspicious osseous
lesions. Soft tissues are unremarkable. Included portion of the
right chest wall is free of acute abnormality.
IMPRESSION: Chronic deformity of the humeral head and glenoid, consistent with
sequela of prior trauma. No definite acute traumatic abnormality.
